# Patient Record
Sex: Female | Born: 1995 | Race: White | Hispanic: No | Marital: Married | Smoking: Never smoker
Health system: Southern US, Community
[De-identification: ages and names within clinical notes are randomized; demographics above are authoritative.]

## PROBLEM LIST (undated history)

## (undated) DIAGNOSIS — L309 Dermatitis, unspecified: Secondary | ICD-10-CM

## (undated) DIAGNOSIS — N3010 Interstitial cystitis (chronic) without hematuria: Secondary | ICD-10-CM

## (undated) HISTORY — DX: Dermatitis, unspecified: L30.9

## (undated) HISTORY — DX: Interstitial cystitis (chronic) without hematuria: N30.10

## (undated) HISTORY — PX: WISDOM TOOTH EXTRACTION: SHX21

---
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
## 2015-02-23 ENCOUNTER — Encounter: Payer: Self-pay | Admitting: Gynecology

## 2015-03-28 ENCOUNTER — Encounter: Payer: Self-pay | Admitting: Gynecology

## 2015-03-28 ENCOUNTER — Ambulatory Visit (INDEPENDENT_AMBULATORY_CARE_PROVIDER_SITE_OTHER): Payer: 59 | Admitting: Gynecology

## 2015-03-28 VITALS — BP 120/74 | Ht 70.0 in | Wt 156.0 lb

## 2015-03-28 DIAGNOSIS — Z01419 Encounter for gynecological examination (general) (routine) without abnormal findings: Secondary | ICD-10-CM

## 2015-03-28 DIAGNOSIS — N912 Amenorrhea, unspecified: Secondary | ICD-10-CM | POA: Diagnosis not present

## 2015-03-28 DIAGNOSIS — Z113 Encounter for screening for infections with a predominantly sexual mode of transmission: Secondary | ICD-10-CM | POA: Diagnosis not present

## 2015-03-28 LAB — COMPREHENSIVE METABOLIC PANEL
ALT: 16 U/L (ref 5–32)
AST: 17 U/L (ref 12–32)
Albumin: 4.7 g/dL (ref 3.6–5.1)
Alkaline Phosphatase: 47 U/L (ref 47–176)
BUN: 6 mg/dL — ABNORMAL LOW (ref 7–20)
CO2: 26 mmol/L (ref 20–31)
Calcium: 9.8 mg/dL (ref 8.9–10.4)
Chloride: 102 mmol/L (ref 98–110)
Creat: 0.74 mg/dL (ref 0.50–1.00)
Glucose, Bld: 71 mg/dL (ref 65–99)
Potassium: 3.7 mmol/L — ABNORMAL LOW (ref 3.8–5.1)
Sodium: 138 mmol/L (ref 135–146)
Total Bilirubin: 0.6 mg/dL (ref 0.2–1.1)
Total Protein: 7.3 g/dL (ref 6.3–8.2)

## 2015-03-28 LAB — CBC WITH DIFFERENTIAL/PLATELET
BASOS PCT: 0 % (ref 0–1)
Basophils Absolute: 0 10*3/uL (ref 0.0–0.1)
Eosinophils Absolute: 0.2 10*3/uL (ref 0.0–0.7)
Eosinophils Relative: 2 % (ref 0–5)
HCT: 41.1 % (ref 36.0–46.0)
HEMOGLOBIN: 14 g/dL (ref 12.0–15.0)
LYMPHS ABS: 2.7 10*3/uL (ref 0.7–4.0)
Lymphocytes Relative: 34 % (ref 12–46)
MCH: 29.5 pg (ref 26.0–34.0)
MCHC: 34.1 g/dL (ref 30.0–36.0)
MCV: 86.7 fL (ref 78.0–100.0)
MONOS PCT: 6 % (ref 3–12)
MPV: 9.6 fL (ref 8.6–12.4)
Monocytes Absolute: 0.5 10*3/uL (ref 0.1–1.0)
NEUTROS ABS: 4.6 10*3/uL (ref 1.7–7.7)
NEUTROS PCT: 58 % (ref 43–77)
Platelets: 315 10*3/uL (ref 150–400)
RBC: 4.74 MIL/uL (ref 3.87–5.11)
RDW: 13.7 % (ref 11.5–15.5)
WBC: 7.9 10*3/uL (ref 4.0–10.5)

## 2015-03-28 MED ORDER — NORETHINDRONE-ETH ESTRADIOL 1-35 MG-MCG PO TABS: 1.0000 | ORAL_TABLET | Freq: Every day | ORAL | Status: DC

## 2015-03-28 NOTE — Patient Instructions (Signed)
Start on the new birth control pills. Call me in several months to let me know how you're doing regardless.

## 2015-03-28 NOTE — Progress Notes (Signed)
Desiree BerlinJessie L Garrett Dec 24, 1995 [id number]        19 y.o.  G0P0  for annual exam.  Several issues noted below.  Past medical history,surgical history, problem list, medications, allergies, family history and social history were all reviewed and documented as reviewed in the EPIC chart.  ROS:  Performed with pertinent positives and negatives included in the history, assessment and plan.   Additional significant findings :  none   Exam: [name] personassistant Filed Vitals:   03/28/15 1157  BP: 120/74  Height: 5\' 10"  (1.778 m)  Weight: 156 lb (70.761 kg)   General appearance:  Normal affect, orientation and appearance. Skin: Grossly normal HEENT: Without gross lesions.  No cervical or supraclavicular adenopathy. Thyroid normal.  Lungs:  Clear without wheezing, rales or rhonchi Cardiac: RR, without RMG Abdominal:  Soft, nontender, without masses, guarding, rebound, organomegaly or hernia Breasts:  Examined lying and sitting without masses, retractions, discharge or axillary adenopathy. Pelvic:  Ext/BUS/vagina normal  Cervix normal. GC/Chlamydia  Uterus anteverted, normal size, shape and contour, midline and mobile nontender   Adnexa  Without masses or tenderness    Anus and perineum  Normal     Assessment/Plan:  19 y.o. G0P0 female for annual exam without menses, oral contraceptives.   1. Amenorrhea. Patient with a history of amenorrhea felt to be secondary to a PCOS type pattern. Had negative TSH FSH prolactin with normal estradiol level. Ultrasound showed multiple small ovarian follicles. Had progesterone withdrawal last year and was started on Loestrin 120 BCPs. Has not had a withdrawal with these with out any bleeding. Patient concerned as far as fertility in the future. I reviewed the whole issue of PCOS and availability of ovulatory stimulation when she decides she wants to proceed with pregnancy.  She is not interested in this at this point. I discussed various options to include continuing on  the low-dose pills despite not was dry this will provide her with endometrial protection. She seems more concerned about not bleeding and will go ahead and start Ortho-Novum 1/35 equivalents to see if this will not stimulate a little bit of withdrawal bleeding with the higher estrogen dose.  The patient is going to call me in several months after starting regardless to let me know how she is doing. 2. STD screening. GC/Chlamydia screen done as she is sexually active. Need for condoms regardless to decrease STD risks. 3. Breast health. SBE monthly reviewed. 4. Gardasil series discussed several times and declined. 5. Health maintenance. Patient requests a general screening blood test. 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 CBC, comprehensive metabolic panel urinalysis ordered. Discussed cholesterol screening at her age not warranted. Patient's going to call me in follow up in several months a source of bleeding.   Dara LordsFONTAINE,Nickey Canedo P MD, 12:30 PM 03/28/2015

## 2015-03-29 ENCOUNTER — Other Ambulatory Visit: Payer: Self-pay | Admitting: Gynecology

## 2015-03-29 DIAGNOSIS — E876 Hypokalemia: Secondary | ICD-10-CM

## 2015-03-29 LAB — URINALYSIS W MICROSCOPIC + REFLEX CULTURE
BACTERIA UA: NONE SEEN [HPF]
BILIRUBIN URINE: NEGATIVE
CRYSTALS: NONE SEEN [HPF]
Casts: NONE SEEN [LPF]
GLUCOSE, UA: NEGATIVE
HGB URINE DIPSTICK: NEGATIVE
Ketones, ur: NEGATIVE
Nitrite: NEGATIVE
PROTEIN: NEGATIVE
RBC / HPF: NONE SEEN RBC/HPF (ref <=2)
Specific Gravity, Urine: 1.003 (ref 1.001–1.035)
YEAST: NONE SEEN [HPF]
pH: 7 (ref 5.0–8.0)

## 2015-03-29 LAB — GC/CHLAMYDIA PROBE AMP
CT Probe RNA: NEGATIVE
GC Probe RNA: NEGATIVE

## 2015-03-29 LAB — HCG, SERUM, QUALITATIVE: Preg, Serum: NEGATIVE

## 2015-03-30 LAB — URINE CULTURE: Colony Count: >=100000

## 2015-05-18 ENCOUNTER — Ambulatory Visit
Admission: EM | Admit: 2015-05-18 | Discharge: 2015-05-18 | Disposition: A | Discharge status: Home / Self Care | Payer: 59 | Attending: Family Medicine | Admitting: Family Medicine

## 2015-05-18 ENCOUNTER — Encounter: Payer: Self-pay | Admitting: Emergency Medicine

## 2015-05-18 DIAGNOSIS — N390 Urinary tract infection, site not specified: Secondary | ICD-10-CM

## 2015-05-18 LAB — URINALYSIS COMPLETE WITH MICROSCOPIC (ARMC ONLY)
BILIRUBIN URINE: NEGATIVE
Glucose, UA: NEGATIVE mg/dL
Ketones, ur: NEGATIVE mg/dL
Nitrite: NEGATIVE
PH: 6 (ref 5.0–8.0)
PROTEIN: NEGATIVE mg/dL
Specific Gravity, Urine: >1.03 — ABNORMAL HIGH (ref 1.005–1.030)

## 2015-05-18 LAB — PREGNANCY, URINE: PREG TEST UR: NEGATIVE

## 2015-05-18 MED ORDER — CIPROFLOXACIN HCL 500 MG PO TABS: 500.0000 mg | ORAL_TABLET | Freq: Two times a day (BID) | ORAL | Status: DC

## 2015-05-18 MED ORDER — PHENAZOPYRIDINE HCL 200 MG PO TABS: 200.0000 mg | ORAL_TABLET | Freq: Three times a day (TID) | ORAL | Status: DC | PRN

## 2015-05-18 NOTE — ED Notes (Signed)
Patient c/o burning when urinating and increase in urinary urgency since yesterday.  Patient denies fevers.  Patient denies N/V.

## 2015-05-18 NOTE — Discharge Instructions (Signed)
Antibiotic Medicine °Antibiotic medicines are used to treat infections caused by bacteria. They work by hurting or killing the germs that are making you sick. °HOW WILL MY MEDICINE BE PICKED? °There are many kinds of antibiotic medicines. To help your doctor pick one, tell your doctor if: °· You have any allergies. °· You are pregnant or plan to get pregnant. °· You are breastfeeding. °· You are taking any medicines. These include over-the-counter medicines, prescription medicines, and herbal remedies. °· You have a medical condition or problem. °If you have questions about why your medicine was picked, ask. °FOR HOW LONG SHOULD I TAKE MY MEDICINE? °Take your medicine for as long as your doctor tells you to. Do not stop taking it when you feel better. If you stop taking it too soon: °· You may start to feel sick again. °· Your infection may get harder to treat. °· New problems may develop. °WHAT IF I MISS A DOSE? °Try not to miss any doses of antibiotic medicine. If you miss a dose: °· Take the dose as soon as you can. °· If you are taking 2 doses a day, take the next dose in 5 to 6 hours. °· If you are taking 3 or more doses a day, take the next dose in 2 to 4 hours. Then go back to the normal schedule. °If you cannot take a missed dose, take the next dose on time. Then take the missed dose after you have taken all the doses as told by your doctor, as if you had one more dose left. °DOES THIS MEDICINE AFFECT BIRTH CONTROL? °Birth control pills may not work while you are on antibiotic medicines. If you are taking birth control pills, keep taking them as usual. Use a second form of birth control, such as a condom. Keep using the second form of birth control until you are finished with your current 1 month cycle of birth control pills. °GET HELP IF: °· You get worse. °· You do not feel better a few days after starting the medicine. °· You throw up (vomit). °· There are white patches in your mouth. °· You have new  joint pain after starting the medicine. °· You have new muscle aches after starting the medicine. °· You had a fever before starting the medicine, and it comes back. °· You have any symptoms of an allergic reaction, such as an itchy rash. If this happens, stop taking the medicine. °GET HELP RIGHT AWAY IF: °· Your pee (urine) turns dark or becomes blood-colored. °· Your skin turns yellow. °· You bruise or bleed easily. °· You have very bad watery poop (diarrhea) and cramps in your belly (abdomen). °· You have a very bad headache. °· You have signs of a very bad allergic reaction, such as: °¨ Trouble breathing. °¨ Wheezing. °¨ Swelling of the lips, tongue, or face. °¨ Fainting. °¨ Blisters on the skin or in the mouth. °If you have signs of a very bad allergic reaction, stop taking the antibiotic medicine right away. °  °This information is not intended to replace advice given to you by your health care provider. Make sure you discuss any questions you have with your health care provider. °  °Document Released: 02/05/2008 Document Revised: 01/17/2015 Document Reviewed: 09/13/2014 °Elsevier Interactive Patient Education ©2016 [employer] Inc[employer] ° °Urinary Tract Infection °A urinary tract infection (UTI) can occur any place along the urinary tract. The tract includes the kidneys, ureters, bladder, and urethra. A type of germ called bacteria   often causes a UTI. UTIs are often helped with antibiotic medicine.  °HOME CARE  °· If given, take antibiotics as told by your doctor. Finish them even if you start to feel better. °· Drink enough fluids to keep your pee (urine) clear or pale yellow. °· Avoid tea, drinks with caffeine, and bubbly (carbonated) drinks. °· Pee often. Avoid holding your pee in for a long time. °· Pee before and after having sex (intercourse). °· Wipe from front to back after you poop (bowel movement) if you are a woman. Use each tissue only once. °GET HELP RIGHT AWAY IF:  °· You have back pain. °· You have  lower belly (abdominal) pain. °· You have chills. °· You feel sick to your stomach (nauseous). °· You throw up (vomit). °· Your burning or discomfort with peeing does not go away. °· You have a fever. °· Your symptoms are not better in 3 days. °MAKE SURE YOU:  °· Understand these instructions. °· Will watch your condition. °· Will get help right away if you are not doing well or get worse. °  °This information is not intended to replace advice given to you by your health care provider. Make sure you discuss any questions you have with your health care provider. °  °Document Released: 10/15/2007 Document Revised: 05/19/2014 Document Reviewed: 11/27/2011 °Elsevier Interactive Patient Education ©2016 Elsevier Inc. ° °

## 2015-05-18 NOTE — ED Provider Notes (Signed)
CSN: [id number]     Arrival date & time 05/18/15  [id number] History   First MD Initiated Contact with Patient 05/18/15 0945    Nurses notes were reviewed. Chief Complaint  Patient presents with  . Dysuria    Patient reports symptoms of dysuria started on Monday and they got worse yesterday Thursday. After seem like it was starting to resolve. She had a UTI before but is always wipes down.  Does not smoke no significant family medical history her medical problems reviewed with the family this pertinent to UTIs. She does not have any significant medical problems.   (Consider location/radiation/quality/duration/timing/severity/associated sxs/prior Treatment) Patient is a 20 y.o. female presenting with dysuria. The history is provided by the patient. No language interpreter was used.  Dysuria Pain quality:  Burning Pain severity:  Moderate Timing:  Intermittent Progression:  Worsening Chronicity:  New Relieved by:  Nothing Urinary symptoms: frequent urination   Urinary symptoms: no hesitancy   Associated symptoms: no abdominal pain, no flank pain and no vaginal discharge   Risk factors: recurrent urinary tract infections   Risk factors: no hx of pyelonephritis, no hx of urolithiasis, not pregnant, no renal disease and not single kidney     History reviewed. No pertinent past medical history. Past Surgical History  Procedure Laterality Date  . Wisdom tooth extraction     Family History  Problem Relation Age of Onset  . Diabetes Father   . Asthma Father   . Asthma Brother   . Cancer Maternal Grandfather     Prostate  . Diabetes Paternal Grandmother   . Diabetes Paternal Grandfather    Social History  Substance Use Topics  . Smoking status: Never Smoker   . Smokeless tobacco: None  . Alcohol Use: No   OB History    Gravida Para Term Preterm AB TAB SAB Ectopic Multiple Living   0              Review of Systems  Gastrointestinal: Negative for abdominal pain and abdominal  distention.  Genitourinary: Positive for dysuria. Negative for flank pain and vaginal discharge.  All other systems reviewed and are negative.   Allergies  Review of patient's allergies indicates no known allergies.  Home Medications   Prior to Admission medications   Medication Sig Start Date End Date Taking? Authorizing Provider  ciprofloxacin (CIPRO) 500 MG tablet Take 1 tablet (500 mg total) by mouth 2 (two) times daily. 05/18/15   Hassan RowanEugene Anjalee Cope, MD  norethindrone-ethinyl estradiol (MICROGESTIN,JUNEL,LOESTRIN) 1-20 MG-MCG tablet Take 1 tablet by mouth daily. Patient not taking: Reported on 03/28/2015 02/27/14   Dara Lordsimothy P Fontaine, MD  norethindrone-ethinyl estradiol 1/35 (ORTHO-NOVUM, NORTREL,CYCLAFEM) tablet Take 1 tablet by mouth daily. 03/28/15   Dara Lordsimothy P Fontaine, MD  phenazopyridine (PYRIDIUM) 200 MG tablet Take 1 tablet (200 mg total) by mouth 3 (three) times daily as needed for pain. 05/18/15   Hassan RowanEugene Eldine Rencher, MD   Meds Ordered and Administered this Visit  Medications - No data to display  BP 131/72 mmHg  Pulse 77  Temp(Src) 98.4 F (36.9 C) (Tympanic)  Resp 16  Ht 5' 10.5" (1.791 m)  Wt 150 lb (68.04 kg)  BMI 21.21 kg/m2  SpO2 100%  LMP 04/27/2015 (Approximate) No data found.   Physical Exam  Constitutional: She is oriented to person, place, and time. She appears well-developed and well-nourished.  HENT:  Head: Normocephalic and atraumatic.  Eyes: Conjunctivae are normal. Pupils are equal, round, and reactive to light.  Neck: Normal range  of motion. Neck supple.  Abdominal: Soft. Bowel sounds are normal. She exhibits no distension and no fluid wave. There is no tenderness. There is no CVA tenderness.  Musculoskeletal: Normal range of motion. She exhibits no edema.  Neurological: She is alert and oriented to person, place, and time.  Skin: Skin is warm and dry.  Psychiatric: She has a normal mood and affect. Her behavior is normal.  Vitals reviewed.   ED Course   Procedures (including critical care time)  Labs Review Labs Reviewed  URINALYSIS COMPLETEWITH MICROSCOPIC ([facility] ONLY) - Abnormal; Notable for the following:    APPearance HAZY (*)    Specific Gravity, Urine >1.030 (*)    Hgb urine dipstick TRACE (*)    Leukocytes, UA 1+ (*)    Bacteria, UA RARE (*)    Squamous Epithelial / LPF 0-5 (*)    All other components within normal limits  URINE CULTURE  PREGNANCY, URINE    Imaging Review No results found.   Visual Acuity Review  Right Eye Distance:   Left Eye Distance:   Bilateral Distance:    Right Eye Near:   Left Eye Near:    Bilateral Near:     Results for orders placed or performed during the hospital encounter of 05/18/15  Urinalysis complete, with microscopic  Result Value Ref Range   Color, Urine YELLOW YELLOW   APPearance HAZY (A) CLEAR   Glucose, UA NEGATIVE NEGATIVE mg/dL   Bilirubin Urine NEGATIVE NEGATIVE   Ketones, ur NEGATIVE NEGATIVE mg/dL   Specific Gravity, Urine >1.030 (H) 1.005 - 1.030   Hgb urine dipstick TRACE (A) NEGATIVE   pH 6.0 5.0 - 8.0   Protein, ur NEGATIVE NEGATIVE mg/dL   Nitrite NEGATIVE NEGATIVE   Leukocytes, UA 1+ (A) NEGATIVE   RBC / HPF 0-5 0 - 5 RBC/hpf   WBC, UA 6-30 0 - 5 WBC/hpf   Bacteria, UA RARE (A) NONE SEEN   Squamous Epithelial / LPF 0-5 (A) NONE SEEN   Mucous PRESENT   Pregnancy, urine  Result Value Ref Range   Preg Test, Ur NEGATIVE NEGATIVE      MDM   1. UTI (lower urinary tract infection)     We'll place on Cipro 500 mg twice a day for 5 days. And Pyridium for 5 days as well. Follow-up PCP if needed for dictation.   Hassan Rowan, MD 05/18/15 1040

## 2015-05-19 LAB — URINE CULTURE: SPECIAL REQUESTS: NORMAL

## 2015-05-23 ENCOUNTER — Telehealth: Payer: Self-pay | Admitting: Emergency Medicine

## 2015-05-23 NOTE — ED Notes (Signed)
Patient was notified that the lab could not complete a urine culture on the specimen that was collected.  Patient states that she is feeling much better and is having no urinary pain or symptoms at this time.  Patient states that she did complete her course of Cipro.  Patient was instructed to follow-up here or with her PCP if her urinary symptoms returned for a repeat urine test.  Patient verbalized understanding.

## 2015-05-28 ENCOUNTER — Telehealth: Payer: Self-pay | Admitting: *Deleted

## 2015-05-28 ENCOUNTER — Telehealth: Payer: Self-pay

## 2015-05-28 NOTE — ED Notes (Signed)
Patient called with complaint of yeast infection due to taking cipro for her UTI. Dr Thurmond ButtsWade was consulted and a prescription for diflucan 150 mg 1 dose [facility] [place] [place] Patient confirmed prescription and location of pharmacy.

## 2015-05-28 NOTE — Telephone Encounter (Signed)
Patient is calling to follow up as requested per Dr. Velvet BatheF at visit. She wanted to let Dr. Velvet BatheF know that the new OC's are working great. She feels good on them and has been on them for two packs and has had a period each pack. She wants to continue taking them.

## 2015-05-28 NOTE — Telephone Encounter (Signed)
Okay to refill through her next annual exam if it is not done already

## 2016-02-24 ENCOUNTER — Ambulatory Visit
Admission: EM | Admit: 2016-02-24 | Discharge: 2016-02-24 | Disposition: A | Discharge status: Home / Self Care | Payer: 59 | Attending: Family Medicine | Admitting: Family Medicine

## 2016-02-24 ENCOUNTER — Encounter: Payer: Self-pay | Admitting: *Deleted

## 2016-02-24 DIAGNOSIS — L02416 Cutaneous abscess of left lower limb: Secondary | ICD-10-CM

## 2016-02-24 DIAGNOSIS — L03116 Cellulitis of left lower limb: Secondary | ICD-10-CM

## 2016-02-24 MED ORDER — SULFAMETHOXAZOLE-TRIMETHOPRIM 800-160 MG PO TABS: 1.0000 | ORAL_TABLET | Freq: Two times a day (BID) | ORAL | 0 refills | Status: DC

## 2016-02-24 MED ORDER — MUPIROCIN 2 % EX OINT: TOPICAL_OINTMENT | CUTANEOUS | 0 refills | Status: DC

## 2016-02-24 MED ORDER — FLUCONAZOLE 150 MG PO TABS: 150.0000 mg | ORAL_TABLET | Freq: Every day | ORAL | 0 refills | Status: DC

## 2016-02-24 NOTE — ED Provider Notes (Signed)
[facility] ____________________________________________  Time seen: Approximately 3:42 PM  I have reviewed the triage vital signs and the nursing notes.   HISTORY  Chief Complaint Leg Injury   HPI Desiree Garrett is a 20 y.o. female presents for evaluation of a red swollen area to left side. Patient reports areas and present 2-3 days. Patient states smart about area started out as a small pimple appearance, unsure if there was a scratch or abrasion present initially. Patient states that she does work in [employer] and often bumps her legs causes scratches as well as animal scratches her, declines known animal scratch. Denies animal bites. Patient reports gradual increase in redness. Patient states felt like the area had a knot in it prompted her to come in today to be evaluated. Denies any history of similar in the past. Denies history of MRSA her previous abscesses. Patient does report her significant other has history of abscesses. Denies any insect bite or tick bite or tick attachment. Patient reports that she feels well otherwise. Patient states the area is mildly tender. Patient reports she did notice that there is a mild amount of drainage earlier today.  Denies fevers. Denies recent antibiotic use or recent sickness. Denies any extremity swelling or decreased range of motion. Denies chest pain, short is recommended abdominal pain, dizziness, dysuria or back pain.  Patient's last menstrual period was 02/24/2016 (exact date). Patient reports just finished her oral birth control pack and started menstrual today. Declines pregnancy.   History reviewed. No pertinent past medical history.  There are no active problems to display for this patient.   Past Surgical History:  Procedure Laterality Date  . WISDOM TOOTH EXTRACTION      Current Outpatient Rx  . Order #: [id number] Class: Normal  . Order #: [id number] Class: Normal  . Order #: [id number] Class: Normal  .  Order #: [id number] Class: Normal  . Order #: [id number] Class: Normal  . Order #: [id number] Class: Normal  . Order #: [id number] Class: Normal    No current facility-administered medications for this encounter.   Current Outpatient Prescriptions:  .  norethindrone-ethinyl estradiol 1/35 (ORTHO-NOVUM, NORTREL,CYCLAFEM) tablet, Take 1 tablet by mouth daily., Disp: 1 Package, Rfl: 12 .  ciprofloxacin (CIPRO) 500 MG tablet, Take 1 tablet (500 mg total) by mouth 2 (two) times daily., Disp: 10 tablet, Rfl: 0 .  fluconazole (DIFLUCAN) 150 MG tablet, Take 1 tablet (150 mg total) by mouth daily. Take one pill orally, then Repeat in one week as needed., Disp: 2 tablet, Rfl: 0 .  mupirocin ointment (BACTROBAN) 2 %, Apply three times a day for 5 days., Disp: 22 g, Rfl: 0 .  norethindrone-ethinyl estradiol (MICROGESTIN,JUNEL,LOESTRIN) 1-20 MG-MCG tablet, Take 1 tablet by mouth daily. (Patient not taking: Reported on 03/28/2015), Disp: 1 Package, Rfl: 11 .  phenazopyridine (PYRIDIUM) 200 MG tablet, Take 1 tablet (200 mg total) by mouth 3 (three) times daily as needed for pain., Disp: 15 tablet, Rfl: 0 .  sulfamethoxazole-trimethoprim (BACTRIM DS,SEPTRA DS) 800-160 MG tablet, Take 1 tablet by mouth 2 (two) times daily., Disp: 20 tablet, Rfl: 0  Allergies Review of patient's allergies indicates no known allergies.  Family History  Problem Relation Age of Onset  . Diabetes Father   . Asthma Father   . Asthma Brother   . Cancer Maternal Grandfather     Prostate  . Diabetes Paternal Grandmother   . Diabetes Paternal Grandfather     Social History Social History  Substance Use Topics  . Smoking status:  Never Smoker  . Smokeless tobacco: Never Used  . Alcohol use 0.0 oz/week    Review of Systems Constitutional: No fever/chills Eyes: No visual changes. ENT: No sore throat. Cardiovascular: Denies chest pain. Respiratory: Denies shortness of breath. Gastrointestinal: No abdominal pain.  No nausea, no  vomiting.  No diarrhea.  No constipation. Genitourinary: Negative for dysuria. Musculoskeletal: Negative for back pain. Skin: Negative for rash.As above.  Neurological: Negative for headaches, focal weakness or numbness.  10-point ROS otherwise negative.  ____________________________________________   PHYSICAL EXAM:  VITAL SIGNS: ED Triage Vitals  Enc Vitals Group     BP 02/24/16 1456 121/73     Pulse Rate 02/24/16 1456 96     Resp 02/24/16 1456 16     Temp 02/24/16 1456 99.1 F (37.3 C)     Temp Source 02/24/16 1456 Oral     SpO2 02/24/16 1456 99 %     Weight 02/24/16 1459 150 lb (68 kg)     Height 02/24/16 1459 5\' 11"  (1.803 m)     Head Circumference --      Peak Flow --      Pain Score 02/24/16 1501 4     Pain Loc --      Pain Edu? --      Excl. in GC? --     Constitutional: Alert and oriented. Well appearing and in no acute distress. Eyes: Conjunctivae are normal. PERRL. EOMI. ENT      Head: Normocephalic and atraumatic.      Nose: No congestion/rhinnorhea.      Mouth/Throat: Mucous membranes are moist. Cardiovascular: Normal rate, regular rhythm. Grossly normal heart sounds.  Good peripheral circulation. Respiratory: Normal respiratory effort without tachypnea nor retractions. Breath sounds are clear and equal bilaterally. No wheezes/rales/rhonchi.. Gastrointestinal: Soft and nontender.  Musculoskeletal:  Ambulatory with steady gait. No midline cervical, thoracic or lumbar tenderness to palpation. Bilateral pedal pulses equal and easily palpated. Neurologic:  Normal speech and language. No gross focal neurologic deficits are appreciated. Speech is normal. No gait instability.  Skin:  Skin is warm, dry and intact. No rash noted. Except : Left distal dorsal thigh area of approximate 2 cm x 2 cm area of induration and mild surrounding erythema, no fluctuance, minimal purulent drainage noted, minimal tenderness to palpation, no surrounding tenderness, no swelling or skin  abnormalities noted. Full range of motion to left knee without tenderness. Left lower extremity otherwise nontender. Psychiatric: Mood and affect are normal. Speech and behavior are normal. Patient exhibits appropriate insight and judgment   ___________________________________________   LABS (all labs ordered are listed, but only abnormal results are displayed)  Labs Reviewed - No data to display   Patient declined culture of purulent drainage. ____________________________________________   PROCEDURES Procedures   INITIAL IMPRESSION / ASSESSMENT AND PLAN / ED COURSE  Pertinent labs & imaging results that were available during my care of the patient were reviewed by me and considered in my medical decision making (see chart for details).  Well appearing patient. No acute distress. Presents with complaints of left thigh redness and tender area. Patient noted with cellulitis with abscess. Abscess with minimal drainage, no fluctuance noted. Counseled regarding treatment plan of care. Patient declined I&D at this time and has some active drainage on its own as well as no fluctuance. Will treat patient with oral Bactrim and topical Bactroban. Patient requests prescription for oral Diflucan, Rx given. Encouraged supportive care, elevation, warm compresses and close monitoring. She declined culture drainage.Discussed indication, risks  and benefits of medications with patient.  Discussed follow up with Primary care physician this week. Discussed follow up and return parameters including no resolution or any worsening concerns. Patient verbalized understanding and agreed to plan.   ____________________________________________   FINAL CLINICAL IMPRESSION(S) / ED DIAGNOSES  Final diagnoses:  Cellulitis and abscess of left leg     Discharge Medication List as of 02/24/2016  3:44 PM    START taking these medications   Details  fluconazole (DIFLUCAN) 150 MG tablet Take 1 tablet (150 mg  total) by mouth daily. Take one pill orally, then Repeat in one week as needed., Starting Sun 02/24/2016, Normal    mupirocin ointment (BACTROBAN) 2 % Apply three times a day for 5 days., Normal    sulfamethoxazole-trimethoprim (BACTRIM DS,SEPTRA DS) 800-160 MG tablet Take 1 tablet by mouth 2 (two) times daily., Starting Sun 02/24/2016, Until Wed 03/05/2016, Normal        Note: This dictation was prepared with Dragon dictation along with smaller phrase technology. Any transcriptional errors that result from this process are unintentional.    Clinical Course      Renford Dills, NP 02/24/16 1829

## 2016-02-24 NOTE — ED Triage Notes (Signed)
Pt has a sore on left thigh, lateral aspect just above knee. 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 Site is reddened, edematous, and tender to touch. Occurred 2 days ago.

## 2016-02-24 NOTE — Discharge Instructions (Signed)
Take medication as prescribed. Rest. Drink plenty of fluids. Elevate and apply warm compresses.   Follow up with your primary care physician this week. Return to Urgent care for new or worsening concerns.

## 2016-02-26 ENCOUNTER — Ambulatory Visit
Admission: EM | Admit: 2016-02-26 | Discharge: 2016-02-26 | Disposition: A | Discharge status: Home / Self Care | Payer: 59 | Attending: Family Medicine | Admitting: Family Medicine

## 2016-02-26 ENCOUNTER — Encounter: Payer: Self-pay | Admitting: *Deleted

## 2016-02-26 DIAGNOSIS — L02416 Cutaneous abscess of left lower limb: Secondary | ICD-10-CM | POA: Diagnosis not present

## 2016-02-26 DIAGNOSIS — L03116 Cellulitis of left lower limb: Secondary | ICD-10-CM | POA: Diagnosis not present

## 2016-02-26 MED ORDER — DOXYCYCLINE HYCLATE 100 MG PO CAPS: 100.0000 mg | ORAL_CAPSULE | Freq: Two times a day (BID) | ORAL | 0 refills | Status: DC

## 2016-02-26 MED ORDER — HYDROCODONE-ACETAMINOPHEN 5-325 MG PO TABS: 1.0000 | ORAL_TABLET | Freq: Three times a day (TID) | ORAL | 0 refills | Status: DC | PRN

## 2016-02-26 MED ORDER — CEFTRIAXONE SODIUM 1 G IJ SOLR
1.0000 g | Freq: Once | INTRAMUSCULAR | Status: AC
  Administered 2016-02-26: 1 g via INTRAMUSCULAR

## 2016-02-26 MED ORDER — LIDOCAINE HCL (PF) 1 % IJ SOLN: 5.0000 mL | Freq: Once | INTRAMUSCULAR | Status: DC

## 2016-02-26 NOTE — ED Triage Notes (Signed)
Left leg insect bite, seen here Sunday. Returns today with increased pain, nausea, and states site is draining. Also states inguinal lymph nodes are enlarged. Feels much worse today.

## 2016-02-26 NOTE — ED Provider Notes (Signed)
[facility] ____________________________________________  Time seen: Approximately 1:27 PM  I have reviewed the triage vital signs and the nursing notes.   HISTORY  Chief Complaint Insect Bite  HPI Desiree Garrett is a 20 y.o. female presents for follow-up and recheck of left leg cellulitis. Patient reports that she was seen here this past Sunday and placed on oral Bactrim. Patient reports she has since taken 5 doses of Bactrim, however reports that the redness in the harness as well as the pain to the area has increased. Patient reports she has had occasional nausea as well as occasional drainage from the site. Patient also reports feeling that she had a an enlarged inguinal left lymph node. Patient reports taking the medication as prescribed. Patient reports has been elevating leg as well as applying warm compresses. Patient states mild pain to the area at this time, increases with direct palpation or with movement.  Denies any pain radiation, paresthesias, other skin changes or other extremity pain. Reports has continued to ambulate and remain active. Denies fevers. Reports continues to eat and drink well. Denies any other complaints.  Patient's last menstrual period was 4 weeks ago. Declines pregnancy and states starting menstrual today.  Elizabeth Sauer, MD: PCP   History reviewed. No pertinent past medical history.  There are no active problems to display for this patient.   Past Surgical History:  Procedure Laterality Date  . WISDOM TOOTH EXTRACTION      Current Outpatient Rx  . Order #: [id number] Class: Normal  . Order #: [id number] Class: Normal  . Order #: [id number] Class: Normal  . Order #: [id number] Class: Normal  . Order #: [id number] Class: Normal  . Order #: [id number] Class: Print  . Order #: [id number] Class: Normal  . Order #: [id number] Class: Normal    No current facility-administered medications for this encounter.   Current Outpatient Prescriptions:  .   ciprofloxacin (CIPRO) 500 MG tablet, Take 1 tablet (500 mg total) by mouth 2 (two) times daily., Disp: 10 tablet, Rfl: 0 .  fluconazole (DIFLUCAN) 150 MG tablet, Take 1 tablet (150 mg total) by mouth daily. Take one pill orally, then Repeat in one week as needed., Disp: 2 tablet, Rfl: 0 .  mupirocin ointment (BACTROBAN) 2 %, Apply three times a day for 5 days., Disp: 22 g, Rfl: 0 .  norethindrone-ethinyl estradiol (MICROGESTIN,JUNEL,LOESTRIN) 1-20 MG-MCG tablet, Take 1 tablet by mouth daily., Disp: 1 Package, Rfl: 11 .  doxycycline (VIBRAMYCIN) 100 MG capsule, Take 1 capsule (100 mg total) by mouth 2 (two) times daily., Disp: 20 capsule, Rfl: 0 .  HYDROcodone-acetaminophen (NORCO/VICODIN) 5-325 MG tablet, Take 1 tablet by mouth every 8 (eight) hours as needed for moderate pain or severe pain (do not drive or operate machinery while taking as can cause drowsiness)., Disp: 6 tablet, Rfl: 0 .  norethindrone-ethinyl estradiol 1/35 (ORTHO-NOVUM, NORTREL,CYCLAFEM) tablet, Take 1 tablet by mouth daily., Disp: 1 Package, Rfl: 12 .  phenazopyridine (PYRIDIUM) 200 MG tablet, Take 1 tablet (200 mg total) by mouth 3 (three) times daily as needed for pain., Disp: 15 tablet, Rfl: 0  Allergies Review of patient's allergies indicates no known allergies.  Family History  Problem Relation Age of Onset  . Diabetes Father   . Asthma Father   . Asthma Brother   . Cancer Maternal Grandfather     Prostate  . Diabetes Paternal Grandmother   . Diabetes Paternal Grandfather     Social History Social History  Substance Use Topics  . Smoking status: Never Smoker  .  Smokeless tobacco: Never Used  . Alcohol use 0.0 oz/week    Review of Systems Constitutional: No fever/chills Eyes: No visual changes. ENT: No sore throat. Cardiovascular: Denies chest pain. Respiratory: Denies shortness of breath. Gastrointestinal: No abdominal pain.  No nausea, no vomiting.  No diarrhea.  No constipation. Genitourinary:  Negative for dysuria. Musculoskeletal: Negative for back pain. Skin: As above. Neurological: Negative for headaches, focal weakness or numbness.  10-point ROS otherwise negative.  ____________________________________________   PHYSICAL EXAM:  VITAL SIGNS: ED Triage Vitals  Enc Vitals Group     BP 02/26/16 1216 125/73     Pulse Rate 02/26/16 1216 89     Resp 02/26/16 1216 16     Temp 02/26/16 1216 98.2 F (36.8 C)     Temp Source 02/26/16 1216 Oral     SpO2 02/26/16 1216 100 %     Weight 02/26/16 1217 150 lb (68 kg)     Height 02/26/16 1217 5\' 11"  (1.803 m)     Head Circumference --      Peak Flow --      Pain Score 02/26/16 1220 7     Pain Loc --      Pain Edu? --      Excl. in GC? --     Constitutional: Alert and oriented. Well appearing and in no acute distress. Eyes: Conjunctivae are normal. PERRL. EOMI. ENT      Head: Normocephalic and atraumatic.      Nose: No congestion/rhinnorhea.      Mouth/Throat: Mucous membranes are moist.Oropharynx non-erythematous. Neck: No stridor. Supple without meningismus.  Hematological/Lymphatic/Immunilogical: No cervical lymphadenopathy.No definitive inguinal lymph node swelling noted. Cardiovascular: Normal rate, regular rhythm. Grossly normal heart sounds.  Good peripheral circulation. Respiratory: Normal respiratory effort without tachypnea nor retractions. Breath sounds are clear and equal bilaterally. No wheezes/rales/rhonchi.. Gastrointestinal: Soft and nontender. No distention.  No CVA tenderness. Musculoskeletal:  Nontender with normal range of motion in all extremities. No midline cervical, thoracic or lumbar tenderness to palpation. Bilateral pedal pulses equal and easily palpated. Except: Left distal lateral thigh with erythematous area of 8 x 9 cm with mild induration, with centered area of mild to moderate induration with slight fluctuance, minimal purulent drainage visible at this time, mild to moderate tenderness to  direct palpation, no further surrounding erythema or tenderness, left knee with full range of motion without difficulty as well as left lower extremity otherwise nontender.No calf tenderness bilaterally.  Neurologic:  Normal speech and language. No gross focal neurologic deficits are appreciated. Speech is normal. No gait instability.  Skin:  Skin is warm, dry and intact. No rash noted. Psychiatric: Mood and affect are normal. Speech and behavior are normal. Patient exhibits appropriate insight and judgment   ___________________________________________   LABS (all labs ordered are listed, but only abnormal results are displayed)  Labs Reviewed  AEROBIC CULTURE (SUPERFICIAL SPECIMEN)     PROCEDURES Procedures  Procedure(s) performed:  Procedure(s) performed:  Procedure explained and verbal consent obtained. Consent: Verbal consent obtained. Written consent not obtained. Risks and benefits: risks, benefits and alternatives were discussed Patient identity confirmed: verbally with patient and hospital-assigned identification number  Consent given by: patient   I&D abscess Location: Left thigh Preparation: Patient was prepped and draped in the usual sterile fashion. Anesthesia with 1% Lidocaine 4 mls Irrigation solution: saline and betadine Amount of cleaning: copious Incision made with #11 blade scalpel mild to moderate amount purulent drainage immediately obtained with expression. Sterile forceps used to probe and  break up loculations.  1/4 " iodoform gauze used and packed.  Patient tolerate well. Wound well approximated post repair.  dressing applied.  Wound care instructions provided.  Observe for any signs of infection or other problems.   ________________________________________   INITIAL IMPRESSION / ASSESSMENT AND PLAN / ED COURSE  Pertinent labs & imaging results that were available during my care of the patient were reviewed by me and considered in my medical decision  making (see chart for details).  Well-appearing patient. No acute distress. Presents for follow-up and recheck of left leg cellulitis. Patient has been taking oral Bactrim as well as applying topical Bactroban as directed. Denies any history of abscesses in the past. Patient denied culture of wound on initial urgent care presentation, as well as declines wound culture at this time. Patient cellulitis does appear to have increased. 1 g IM Rocephin given once in urgent care. Abscess noted, I&D performed, patient tolerated well, mild to moderate amount of purulent material expressed. Packing placed. Discussed in detail with patient will change prescription to oral doxycycline, when necessary Vicodin half tablet as needed for breakthrough pain, rest and elevate. Patient requests further work note, work note for today and tomorrow given in the directed patient to follow up with primary care physician Dr. Yetta Barre in 2 days. Wound area traced and marked with skin pending. Directed patient strict follow-up and return parameters. Instructed regarding fevers, increased pain, increased swelling proceed directly to emergency room for possible IV antibiotics.  Discussed follow up with Primary care physician this week. Discussed follow up and return parameters including no resolution or any worsening concerns. Patient verbalized understanding and agreed to plan.   ____________________________________________   FINAL CLINICAL IMPRESSION(S) / ED DIAGNOSES  Final diagnoses:  Cellulitis and abscess of left leg     Discharge Medication List as of 02/26/2016  2:10 PM    START taking these medications   Details  doxycycline (VIBRAMYCIN) 100 MG capsule Take 1 capsule (100 mg total) by mouth 2 (two) times daily., Starting Tue 02/26/2016, Normal    HYDROcodone-acetaminophen (NORCO/VICODIN) 5-325 MG tablet Take 1 tablet by mouth every 8 (eight) hours as needed for moderate pain or severe pain (do not drive or operate  machinery while taking as can cause drowsiness)., Starting Tue 02/26/2016, Print        Note: This dictation was prepared with Dragon dictation along with smaller phrase technology. Any transcriptional errors that result from this process are unintentional.    Clinical Course      Renford Dills, NP 02/26/16 1936

## 2016-02-26 NOTE — Discharge Instructions (Signed)
Take medication as prescribed. Elevate. Keep clean. Monitor closely.   Follow up with your primary care physician in 2 days. Return to Urgent care for new or worsening concerns. As dicussed, proceed to ER for any worsening concerns.

## 2016-02-29 ENCOUNTER — Encounter: Payer: Self-pay | Admitting: Emergency Medicine

## 2016-02-29 ENCOUNTER — Ambulatory Visit
Admission: EM | Admit: 2016-02-29 | Discharge: 2016-02-29 | Disposition: A | Discharge status: Home / Self Care | Payer: 59 | Attending: Family Medicine | Admitting: Family Medicine

## 2016-02-29 ENCOUNTER — Encounter: Payer: Self-pay | Admitting: Family Medicine

## 2016-02-29 ENCOUNTER — Encounter: Payer: 59 | Admitting: Family Medicine

## 2016-02-29 DIAGNOSIS — L0291 Cutaneous abscess, unspecified: Secondary | ICD-10-CM

## 2016-02-29 NOTE — ED Provider Notes (Signed)
CSN: [id number]     Arrival date & time 02/29/16  [id number] History   First MD Initiated Contact with Patient 02/29/16 0932     Chief Complaint  Patient presents with  . Abscess   (Consider location/radiation/quality/duration/timing/severity/associated sxs/prior Treatment) HPI  This a 20 year old female who returns today following an I&D of a abscess of her distal left lateral thigh. She had had a large cellulitis with the abscess was initially treated with Septra. This did not seem to help and she returned. He required I&D with packing switching her to doxycycline and aggressive local care with warm compresses. His noticed a marked improvement in the amount of induration and no fluctuance. She still has purulence draining from the packing. The abscess has greatly consolidated according to the outline her last visit. She has been afebrile has not had any chills.     History reviewed. No pertinent past medical history. Past Surgical History:  Procedure Laterality Date  . WISDOM TOOTH EXTRACTION     Family History  Problem Relation Age of Onset  . Diabetes Father   . Asthma Father   . Asthma Brother   . Cancer Maternal Grandfather     Prostate  . Diabetes Paternal Grandmother   . Diabetes Paternal Grandfather    Social History  Substance Use Topics  . Smoking status: Never Smoker  . Smokeless tobacco: Never Used  . Alcohol use 0.0 oz/week   OB History    Gravida Para Term Preterm AB Living   0             SAB TAB Ectopic Multiple Live Births                 Review of Systems  Constitutional: Positive for activity change. Negative for chills, fatigue and fever.  Skin: Positive for wound.  All other systems reviewed and are negative.   Allergies  Review of patient's allergies indicates no known allergies.  Home Medications   Prior to Admission medications   Medication Sig Start Date End Date Taking? Authorizing Provider  doxycycline (VIBRAMYCIN) 100 MG capsule Take 1  capsule (100 mg total) by mouth 2 (two) times daily. 02/26/16   Renford DillsLindsey Brandy, NP  fluconazole (DIFLUCAN) 150 MG tablet Take 1 tablet (150 mg total) by mouth daily. Take one pill orally, then Repeat in one week as needed. 02/24/16   Renford DillsLindsey Knoth, NP  HYDROcodone-acetaminophen (NORCO/VICODIN) 5-325 MG tablet Take 1 tablet by mouth every 8 (eight) hours as needed for moderate pain or severe pain (do not drive or operate machinery while taking as can cause drowsiness). 02/26/16   Renford DillsLindsey Been, NP  mupirocin ointment (BACTROBAN) 2 % Apply three times a day for 5 days. 02/24/16   Renford DillsLindsey Alcocer, NP  norethindrone-ethinyl estradiol 1/35 (ORTHO-NOVUM, NORTREL,CYCLAFEM) tablet Take 1 tablet by mouth daily. 03/28/15   Dara Lordsimothy P Fontaine, MD   Meds Ordered and Administered this Visit  Medications - No data to display  BP 116/77 (BP Location: Left Arm)   Pulse 95   Temp 97.4 F (36.3 C) (Tympanic)   Resp 16   Ht 5\' 11"  (1.803 m)   Wt 149 lb 11.2 oz (67.9 kg)   LMP 02/24/2016 (Exact Date)   SpO2 100%   BMI 20.88 kg/m  No data found.   Physical Exam  Constitutional: She is oriented to person, place, and time. She appears well-developed and well-nourished. No distress.  HENT:  Head: Normocephalic and atraumatic.  Eyes: EOM are normal. Pupils are equal,  round, and reactive to light.  Neck: Normal range of motion. Neck supple.  Musculoskeletal: Normal range of motion.  Neurological: She is alert and oriented to person, place, and time.  Skin: Skin is warm and dry. She is not diaphoretic.  Examination of the abscess of the left side distal lateral shows a packing that was in place with sanguinous purulent drainage present. The drain was removed. Area of induration has a consolidated now to approximately 2 cm x 4 cm. Manipulation of the wound failed to have any more purulence expressed. She did have some blood drainage.  Psychiatric: She has a normal mood and affect. Her behavior is normal.  Judgment and thought content normal.  Nursing note and vitals reviewed.   Urgent Care Course   Clinical Course    Procedures (including critical care time)  Labs Review Labs Reviewed - No data to display  Imaging Review No results found.   Visual Acuity Review  Right Eye Distance:   Left Eye Distance:   Bilateral Distance:    Right Eye Near:   Left Eye Near:    Bilateral Near:         MDM   1. Abscess    The area of induration was outlined in surgical marker. The patient will continue on the doxycycline as this seems to be working well. I decided not to replace the drain as there is no further purulence. Also the incision was at the tip of the indurated area and would have to be extended further . More aggressive local wound care may help absorb the indurated area and the patient will be followed in 2 days and decision made whether any fluctuance needs to be addressed at that time. She will continue with warm compresses that she has been performing. We will increase it to 4 times daily. Dry the area and placed Bactroban on the wound. I'll keep her out of work today and tomorrow with a plan on having her return to full duty on Sunday unless there is a change in her condition. She was fully informed of our plan.    Lutricia Feil, PA-C 02/29/16 1015

## 2016-02-29 NOTE — ED Triage Notes (Signed)
Patient here for wound check and possible repacking.  Patient reports drainage from the site.  Patient denies fevers or chills.

## 2016-03-02 ENCOUNTER — Encounter: Payer: Self-pay | Admitting: Gynecology

## 2016-03-02 ENCOUNTER — Ambulatory Visit
Admission: EM | Admit: 2016-03-02 | Discharge: 2016-03-02 | Disposition: A | Discharge status: Home / Self Care | Payer: 59 | Attending: Family Medicine | Admitting: Family Medicine

## 2016-03-02 DIAGNOSIS — Z4801 Encounter for change or removal of surgical wound dressing: Secondary | ICD-10-CM

## 2016-03-02 DIAGNOSIS — Z5189 Encounter for other specified aftercare: Secondary | ICD-10-CM

## 2016-03-02 NOTE — ED Provider Notes (Signed)
CSN: [id number]     Arrival date & time 03/02/16  0807 History   None    Chief Complaint  Patient presents with  . Wound Check   (Consider location/radiation/quality/duration/timing/severity/associated sxs/prior Treatment) HPI  Patient returns with more improvement. Indurated area less without purulence. Still using doxycycline and warm compresses. No fever or chills.  History reviewed. No pertinent past medical history. Past Surgical History:  Procedure Laterality Date  . WISDOM TOOTH EXTRACTION     Family History  Problem Relation Age of Onset  . Diabetes Father   . Asthma Father   . Asthma Brother   . Cancer Maternal Grandfather     Prostate  . Diabetes Paternal Grandmother   . Diabetes Paternal Grandfather    Social History  Substance Use Topics  . Smoking status: Never Smoker  . Smokeless tobacco: Never Used  . Alcohol use 0.0 oz/week   OB History    Gravida Para Term Preterm AB Living   0             SAB TAB Ectopic Multiple Live Births                 Review of Systems  Constitutional: Negative for activity change, chills, fatigue and fever.  Skin: Positive for color change and wound.  All other systems reviewed and are negative.   Allergies  Review of patient's allergies indicates no known allergies.  Home Medications   Prior to Admission medications   Medication Sig Start Date End Date Taking? Authorizing Provider  doxycycline (VIBRAMYCIN) 100 MG capsule Take 1 capsule (100 mg total) by mouth 2 (two) times daily. 02/26/16  Yes Renford Dills, NP  fluconazole (DIFLUCAN) 150 MG tablet Take 1 tablet (150 mg total) by mouth daily. Take one pill orally, then Repeat in one week as needed. 02/24/16  Yes Renford Dills, NP  norethindrone-ethinyl estradiol 1/35 (ORTHO-NOVUM, NORTREL,CYCLAFEM) tablet Take 1 tablet by mouth daily. 03/28/15  Yes Dara Lords, MD  HYDROcodone-acetaminophen (NORCO/VICODIN) 5-325 MG tablet Take 1 tablet by mouth every 8 (eight)  hours as needed for moderate pain or severe pain (do not drive or operate machinery while taking as can cause drowsiness). 02/26/16   Renford Dills, NP  mupirocin ointment (BACTROBAN) 2 % Apply three times a day for 5 days. 02/24/16   Renford Dills, NP   Meds Ordered and Administered this Visit  Medications - No data to display  BP 119/69 (BP Location: Left Arm)   Pulse 69   Temp 98.3 F (36.8 C) (Oral)   Resp 16   LMP 02/24/2016 (Exact Date)   SpO2 100%  No data found.   Physical Exam  Constitutional: She is oriented to person, place, and time. She appears well-developed and well-nourished. No distress.  HENT:  Head: Normocephalic and atraumatic.  Eyes: EOM are normal. Pupils are equal, round, and reactive to light.  Neck: Normal range of motion. Neck supple.  Musculoskeletal: Normal range of motion. She exhibits no edema, tenderness or deformity.  Neurological: She is alert and oriented to person, place, and time.  Skin: Skin is warm and dry. She is not diaphoretic.  Induration much improved. No fluctuance. No drainage. Less erythema.   Psychiatric: She has a normal mood and affect. Her behavior is normal. Judgment and thought content normal.  Nursing note and vitals reviewed.   Urgent Care Course   Clinical Course    Procedures (including critical care time)  Labs Review Labs Reviewed - No data to  display  Imaging Review No results found.   Visual Acuity Review  Right Eye Distance:   Left Eye Distance:   Bilateral Distance:    Right Eye Near:   Left Eye Near:    Bilateral Near:         MDM   1. Visit for wound check   2. Abscess re-check    Will keep on warm compresses until induration resolved. Finish doxycycline. F/U PRN. RTW today.    Lutricia FeilWilliam P Audy Dauphine, PA-C 03/02/16 [phone]

## 2016-03-02 NOTE — ED Triage Notes (Signed)
Follow up x 2 days ago. Left knee abscess.

## 2016-03-03 NOTE — Progress Notes (Signed)
Erroneous encounter

## 2016-03-03 NOTE — Patient Instructions (Signed)
Erroneous encounter

## 2016-03-21 ENCOUNTER — Other Ambulatory Visit: Payer: Self-pay | Admitting: Gynecology

## 2016-03-28 ENCOUNTER — Ambulatory Visit (INDEPENDENT_AMBULATORY_CARE_PROVIDER_SITE_OTHER): Payer: 59 | Admitting: Gynecology

## 2016-03-28 ENCOUNTER — Encounter: Payer: Self-pay | Admitting: Gynecology

## 2016-03-28 VITALS — BP 120/70 | Ht 69.5 in | Wt 153.0 lb

## 2016-03-28 DIAGNOSIS — Z113 Encounter for screening for infections with a predominantly sexual mode of transmission: Secondary | ICD-10-CM | POA: Diagnosis not present

## 2016-03-28 DIAGNOSIS — Z01419 Encounter for gynecological examination (general) (routine) without abnormal findings: Secondary | ICD-10-CM | POA: Diagnosis not present

## 2016-03-28 LAB — COMPREHENSIVE METABOLIC PANEL
ALBUMIN: 4.4 g/dL (ref 3.6–5.1)
ALT: 17 U/L (ref 6–29)
AST: 17 U/L (ref 10–30)
Alkaline Phosphatase: 40 U/L (ref 33–115)
BILIRUBIN TOTAL: 0.3 mg/dL (ref 0.2–1.2)
BUN: 7 mg/dL (ref 7–25)
CO2: 28 mmol/L (ref 20–31)
CREATININE: 0.68 mg/dL (ref 0.50–1.10)
Calcium: 9.5 mg/dL (ref 8.6–10.2)
Chloride: 104 mmol/L (ref 98–110)
GLUCOSE: 82 mg/dL (ref 65–99)
Potassium: 4 mmol/L (ref 3.5–5.3)
SODIUM: 139 mmol/L (ref 135–146)
Total Protein: 7.1 g/dL (ref 6.1–8.1)

## 2016-03-28 LAB — CBC WITH DIFFERENTIAL/PLATELET
Basophils Absolute: 62 cells/uL (ref 0–200)
Basophils Relative: 1 %
EOS PCT: 6 %
Eosinophils Absolute: 372 cells/uL (ref 15–500)
HCT: 40.8 % (ref 35.0–45.0)
HEMOGLOBIN: 13.3 g/dL (ref 11.7–15.5)
LYMPHS ABS: 2542 {cells}/uL (ref 850–3900)
Lymphocytes Relative: 41 %
MCH: 29.2 pg (ref 27.0–33.0)
MCHC: 32.6 g/dL (ref 32.0–36.0)
MCV: 89.7 fL (ref 80.0–100.0)
MONOS PCT: 6 %
MPV: 9.6 fL (ref 7.5–12.5)
Monocytes Absolute: 372 cells/uL (ref 200–950)
NEUTROS ABS: 2852 {cells}/uL (ref 1500–7800)
NEUTROS PCT: 46 %
PLATELETS: 320 10*3/uL (ref 140–400)
RBC: 4.55 MIL/uL (ref 3.80–5.10)
RDW: 13.6 % (ref 11.0–15.0)
WBC: 6.2 10*3/uL (ref 3.8–10.8)

## 2016-03-28 MED ORDER — NORETHINDRONE-ETH ESTRADIOL 1-35 MG-MCG PO TABS: 1.0000 | ORAL_TABLET | Freq: Every day | ORAL | 12 refills | Status: DC

## 2016-03-28 NOTE — Patient Instructions (Signed)
Recommend that you receive the Gardasil vaccine.  You may obtain a copy of any labs that were done today by logging onto MyChart as outlined in the instructions provided with your AVS (after visit summary). The office will not call with normal lab results but certainly if there are any significant abnormalities then we will contact you.   Health Maintenance Adopting a healthy lifestyle and getting preventive care can go a long way to promote health and wellness. Talk with your health care provider about what schedule of regular examinations is right for you. This is a good chance for you to check in with your provider about disease prevention and staying healthy. In between checkups, there are plenty of things you can do on your own. Experts have done a lot of research about which lifestyle changes and preventive measures are most likely to keep you healthy. Ask your health care provider for more information. WEIGHT AND DIET  Eat a healthy diet  Be sure to include plenty of vegetables, fruits, low-fat dairy products, and lean protein.  Do not eat a lot of foods high in solid fats, added sugars, or salt.  Get regular exercise. This is one of the most important things you can do for your health.  Most adults should exercise for at least 150 minutes each week. The exercise should increase your heart rate and make you sweat (moderate-intensity exercise).  Most adults should also do strengthening exercises at least twice a week. This is in addition to the moderate-intensity exercise.  Maintain a healthy weight  Body mass index (BMI) is a measurement that can be used to identify possible weight problems. It estimates body fat based on height and weight. Your health care provider can help determine your BMI and help you achieve or maintain a healthy weight.  For females 4 years of age and older:   A BMI below 18.5 is considered underweight.  A BMI of 18.5 to 24.9 is normal.  A BMI of 25 to  29.9 is considered overweight.  A BMI of 30 and above is considered obese.  Watch levels of cholesterol and blood lipids  You should start having your blood tested for lipids and cholesterol at 20 years of age, then have this test every 5 years.  You may need to have your cholesterol levels checked more often if:  Your lipid or cholesterol levels are high.  You are older than 20 years of age.  You are at high risk for heart disease.  CANCER SCREENING   Lung Cancer  Lung cancer screening is recommended for adults 8-71 years old who are at high risk for lung cancer because of a history of smoking.  A yearly low-dose CT scan of the lungs is recommended for people who:  Currently smoke.  Have quit within the past 15 years.  Have at least a 30-pack-year history of smoking. A pack year is smoking an average of one pack of cigarettes a day for 1 year.  Yearly screening should continue until it has been 15 years since you quit.  Yearly screening should stop if you develop a health problem that would prevent you from having lung cancer treatment.  Breast Cancer  Practice breast self-awareness. This means understanding how your breasts normally appear and feel.  It also means doing regular breast self-exams. Let your health care provider know about any changes, no matter how small.  If you are in your 20s or 30s, you should have a clinical breast exam (  CBE) by a health care provider every 1-3 years as part of a regular health exam.  If you are 40 or older, have a CBE every year. Also consider having a breast X-ray (mammogram) every year.  If you have a family history of breast cancer, talk to your health care provider about genetic screening.  If you are at high risk for breast cancer, talk to your health care provider about having an MRI and a mammogram every year.  Breast cancer gene (BRCA) assessment is recommended for women who have family members with BRCA-related  cancers. BRCA-related cancers include:  Breast.  Ovarian.  Tubal.  Peritoneal cancers.  Results of the assessment will determine the need for genetic counseling and BRCA1 and BRCA2 testing. Cervical Cancer Routine pelvic examinations to screen for cervical cancer are no longer recommended for nonpregnant women who are considered low risk for cancer of the pelvic organs (ovaries, uterus, and vagina) and who do not have symptoms. A pelvic examination may be necessary if you have symptoms including those associated with pelvic infections. Ask your health care provider if a screening pelvic exam is right for you.   The Pap test is the screening test for cervical cancer for women who are considered at risk.  If you had a hysterectomy for a problem that was not cancer or a condition that could lead to cancer, then you no longer need Pap tests.  If you are older than 65 years, and you have had normal Pap tests for the past 10 years, you no longer need to have Pap tests.  If you have had past treatment for cervical cancer or a condition that could lead to cancer, you need Pap tests and screening for cancer for at least 20 years after your treatment.  If you no longer get a Pap test, assess your risk factors if they change (such as having a new sexual partner). This can affect whether you should start being screened again.  Some women have medical problems that increase their chance of getting cervical cancer. If this is the case for you, your health care provider may recommend more frequent screening and Pap tests.  The human papillomavirus (HPV) test is another test that may be used for cervical cancer screening. The HPV test looks for the virus that can cause cell changes in the cervix. The cells collected during the Pap test can be tested for HPV.  The HPV test can be used to screen women 30 years of age and older. Getting tested for HPV can extend the interval between normal Pap tests from  three to five years.  An HPV test also should be used to screen women of any age who have unclear Pap test results.  After 20 years of age, women should have HPV testing as often as Pap tests.  Colorectal Cancer  This type of cancer can be detected and often prevented.  Routine colorectal cancer screening usually begins at 20 years of age and continues through 20 years of age.  Your health care provider may recommend screening at an earlier age if you have risk factors for colon cancer.  Your health care provider may also recommend using home test kits to check for hidden blood in the stool.  A small camera at the end of a tube can be used to examine your colon directly (sigmoidoscopy or colonoscopy). This is done to check for the earliest forms of colorectal cancer.  Routine screening usually begins at age 50.    Direct examination of the colon should be repeated every 5-10 years through 20 years of age. However, you may need to be screened more often if early forms of precancerous polyps or small growths are found. Skin Cancer  Check your skin from head to toe regularly.  Tell your health care provider about any new moles or changes in moles, especially if there is a change in a mole's shape or color.  Also tell your health care provider if you have a mole that is larger than the size of a pencil eraser.  Always use sunscreen. Apply sunscreen liberally and repeatedly throughout the day.  Protect yourself by wearing long sleeves, pants, a wide-brimmed hat, and sunglasses whenever you are outside. HEART DISEASE, DIABETES, AND HIGH BLOOD PRESSURE   Have your blood pressure checked at least every 1-2 years. High blood pressure causes heart disease and increases the risk of stroke.  If you are between 55 years and 79 years old, ask your health care provider if you should take aspirin to prevent strokes.  Have regular diabetes screenings. This involves taking a blood sample to check  your fasting blood sugar level.  If you are at a normal weight and have a low risk for diabetes, have this test once every three years after 20 years of age.  If you are overweight and have a high risk for diabetes, consider being tested at a younger age or more often. PREVENTING INFECTION  Hepatitis B  If you have a higher risk for hepatitis B, you should be screened for this virus. You are considered at high risk for hepatitis B if:  You were born in a country where hepatitis B is common. Ask your health care provider which countries are considered high risk.  Your parents were born in a high-risk country, and you have not been immunized against hepatitis B (hepatitis B vaccine).  You have HIV or AIDS.  You use needles to inject street drugs.  You live with someone who has hepatitis B.  You have had sex with someone who has hepatitis B.  You get hemodialysis treatment.  You take certain medicines for conditions, including cancer, organ transplantation, and autoimmune conditions. Hepatitis C  Blood testing is recommended for:  Everyone born from 1945 through 1965.  Anyone with known risk factors for hepatitis C. Sexually transmitted infections (STIs)  You should be screened for sexually transmitted infections (STIs) including gonorrhea and chlamydia if:  You are sexually active and are younger than 20 years of age.  You are older than 20 years of age and your health care provider tells you that you are at risk for this type of infection.  Your sexual activity has changed since you were last screened and you are at an increased risk for chlamydia or gonorrhea. Ask your health care provider if you are at risk.  If you do not have HIV, but are at risk, it may be recommended that you take a prescription medicine daily to prevent HIV infection. This is called pre-exposure prophylaxis (PrEP). You are considered at risk if:  You are sexually active and do not regularly use  condoms or know the HIV status of your partner(s).  You take drugs by injection.  You are sexually active with a partner who has HIV. Talk with your health care provider about whether you are at high risk of being infected with HIV. If you choose to begin PrEP, you should first be tested for HIV. You should then be   tested every 3 months for as long as you are taking PrEP.  PREGNANCY   If you are premenopausal and you may become pregnant, ask your health care provider about preconception counseling.  If you may become pregnant, take 400 to 800 micrograms (mcg) of folic acid every day.  If you want to prevent pregnancy, talk to your health care provider about birth control (contraception). OSTEOPOROSIS AND MENOPAUSE   Osteoporosis is a disease in which the bones lose minerals and strength with aging. This can result in serious bone fractures. Your risk for osteoporosis can be identified using a bone density scan.  If you are 23 years of age or older, or if you are at risk for osteoporosis and fractures, ask your health care provider if you should be screened.  Ask your health care provider whether you should take a calcium or vitamin D supplement to lower your risk for osteoporosis.  Menopause may have certain physical symptoms and risks.  Hormone replacement therapy may reduce some of these symptoms and risks. Talk to your health care provider about whether hormone replacement therapy is right for you.  HOME CARE INSTRUCTIONS   Schedule regular health, dental, and eye exams.  Stay current with your immunizations.   Do not use any tobacco products including cigarettes, chewing tobacco, or electronic cigarettes.  If you are pregnant, do not drink alcohol.  If you are breastfeeding, limit how much and how often you drink alcohol.  Limit alcohol intake to no more than 1 drink per day for nonpregnant women. One drink equals 12 ounces of beer, 5 ounces of wine, or 1 ounces of hard  liquor.  Do not use street drugs.  Do not share needles.  Ask your health care provider for help if you need support or information about quitting drugs.  Tell your health care provider if you often feel depressed.  Tell your health care provider if you have ever been abused or do not feel safe at home. Document Released: 11/11/2010 Document Revised: 09/12/2013 Document Reviewed: 03/30/2013 [facility] Patient Information 2015 [place], [place]. This information is not intended to replace advice given to you by your health care provider. Make sure you discuss any questions you have with your health care provider.

## 2016-03-28 NOTE — Progress Notes (Signed)
    Janet BerlinJessie L Junco 1996-04-09 [id number]        20 y.o.  G0P0  for annual exam.  Past medical history,surgical history, problem list, medications, allergies, family history and social history were all reviewed and documented as reviewed in the EPIC chart.  ROS:  Performed with pertinent positives and negatives included in the history, assessment and plan.   Additional significant findings :  None   Exam: Kennon PortelaKim Gardner assistant Vitals:   03/28/16 1059  BP: 120/70  Weight: 153 lb (69.4 kg)  Height: 5' 9.5" (1.765 m)   Body mass index is 22.27 kg/m.  General appearance:  Normal affect, orientation and appearance. Skin: Grossly normal HEENT: Without gross lesions.  No cervical or supraclavicular adenopathy. Thyroid normal.  Lungs:  Clear without wheezing, rales or rhonchi Cardiac: RR, without RMG Abdominal:  Soft, nontender, without masses, guarding, rebound, organomegaly or hernia Breasts:  Examined lying and sitting without masses, retractions, discharge or axillary adenopathy. Pelvic:  Ext, BUS, Vagina normal  Cervix normal. Pap smear done. GC/Chlamydia  Uterus anteverted, normal size, shape and contour, midline and mobile nontender   Adnexa without masses or tenderness    Anus and perineum normal    Assessment/Plan:  20 y.o. G0P0 female for annual exam with regular menses, oral contraceptives.   1. Oral contraceptives. Doing well on 1/35 pill. Having light regular menses. Refill 1 year provided. 2. STD screening. GC/Chlamydia done. Serum screening declined. 3. Gardasil series never. I again reviewed the benefits of the series of my recommendation that she receive these. She does not want to initiate now but will think about it return if she decides to pursue this.  4. Pap smear done today as her first Pap smear. She will be turning 21 within the next several months. 5. Breast health. SBE monthly reviewed. 6. Health maintenance. Patient requests routine blood work. CBC,  comprehensive metabolic panel, urinalysis ordered. Follow up in one year, sooner as needed.   Dara LordsFONTAINE,TIMOTHY P MD, 11:30 AM 03/28/2016

## 2016-03-28 NOTE — Addendum Note (Signed)
Addended by: Dayna BarkerGARDNER, Daviana Haymaker K on: 03/28/2016 11:37 AM   Modules accepted: Orders

## 2016-03-29 LAB — URINALYSIS W MICROSCOPIC + REFLEX CULTURE
Bacteria, UA: NONE SEEN [HPF]
Bilirubin Urine: NEGATIVE
CASTS: NONE SEEN [LPF]
Crystals: NONE SEEN [HPF]
Glucose, UA: NEGATIVE
Hgb urine dipstick: NEGATIVE
Ketones, ur: NEGATIVE
NITRITE: NEGATIVE
PH: 7 (ref 5.0–8.0)
Protein, ur: NEGATIVE
RBC / HPF: NONE SEEN RBC/HPF (ref <=2)
SPECIFIC GRAVITY, URINE: 1.008 (ref 1.001–1.035)
YEAST: NONE SEEN [HPF]

## 2016-03-29 LAB — GC/CHLAMYDIA PROBE AMP
CT PROBE, AMP APTIMA: NOT DETECTED
GC Probe RNA: NOT DETECTED

## 2016-03-30 LAB — URINE CULTURE

## 2016-04-02 LAB — PAP IG W/ RFLX HPV ASCU

## 2016-08-04 ENCOUNTER — Ambulatory Visit
Admission: EM | Admit: 2016-08-04 | Discharge: 2016-08-04 | Disposition: A | Discharge status: Home / Self Care | Payer: 59 | Attending: Emergency Medicine | Admitting: Emergency Medicine

## 2016-08-04 DIAGNOSIS — N390 Urinary tract infection, site not specified: Secondary | ICD-10-CM | POA: Diagnosis not present

## 2016-08-04 DIAGNOSIS — R8281 Pyuria: Secondary | ICD-10-CM

## 2016-08-04 DIAGNOSIS — R300 Dysuria: Secondary | ICD-10-CM

## 2016-08-04 LAB — URINALYSIS, COMPLETE (UACMP) WITH MICROSCOPIC

## 2016-08-04 MED ORDER — PHENAZOPYRIDINE HCL 200 MG PO TABS: 200.0000 mg | ORAL_TABLET | Freq: Three times a day (TID) | ORAL | 0 refills | Status: DC | PRN

## 2016-08-04 NOTE — ED Triage Notes (Signed)
Patient complains of urinary symptoms. Patient states that she has urinary urgency. Patient states that she has taken azo today. Patient states that she has some burning in vaginal region.

## 2016-08-04 NOTE — ED Provider Notes (Signed)
HPI  SUBJECTIVE:  Desiree Garrett is a 21 y.o. female who presents with  dysuria and urinary urgency starting today. She reports some "vaginal discomfort" but denies genital itching, swelling, rash, vaginal odor, discharge. She tried Azo and warm compresses with improvement in her symptoms. No aggravating factors. She denies urinary frequency, cloudy or odorous urine, hematuria. No abdominal, pelvic or back pain. No nausea, vomiting, fevers. No scented body washes, bubble baths. No antibiotics recently. She has not been spending time in wet athletic gear recently. She is in a long-term monogamous relationship with a female who is asymptomatic. She states that he was tested 3 weeks ago for HIV, syphilis, gonorrhea Chlamydia and Trichomonas and everything came back negative. She denies dyspareunia. She has not been tested for STDs since November 2017, but she states that STDs are not a concern today. She states that she does not drink sodas or caffeine, but that she drinks "lots of Kool-Aid." States that she did a home pregnancy test earlier today that was negative. She has a past medical history of UTIs and yeast infections, eczema. No history of gonorrhea chlamydia herpes HIV syphilis Trichomonas or BV. LMP: 3 weeks ago. Denies the possibility of being pregnant. PMD: Dr. Yetta BarreJones.   History reviewed. No pertinent past medical history.  Past Surgical History:  Procedure Laterality Date  . WISDOM TOOTH EXTRACTION      Family History  Problem Relation Age of Onset  . Diabetes Father   . Asthma Father   . Asthma Brother   . Cancer Maternal Grandfather     Prostate  . Diabetes Paternal Grandmother   . Diabetes Paternal Grandfather   . Heart disease Paternal Grandfather     Social History  Substance Use Topics  . Smoking status: Never Smoker  . Smokeless tobacco: Never Used  . Alcohol use 0.0 oz/week    No current facility-administered medications for this encounter.   Current Outpatient  Prescriptions:  .  norethindrone-ethinyl estradiol 1/35 (ALAYCEN 1/35) tablet, Take 1 tablet by mouth daily., Disp: 28 tablet, Rfl: 12 .  phenazopyridine (PYRIDIUM) 200 MG tablet, Take 1 tablet (200 mg total) by mouth 3 (three) times daily as needed for pain., Disp: 6 tablet, Rfl: 0  No Known Allergies   ROS  As noted in HPI.   Physical Exam  BP 138/80 (BP Location: Left Arm)   Pulse 88   Temp 98.9 F (37.2 C) (Oral)   Resp 18   Ht 5\' 10"  (1.778 m)   Wt 150 lb (68 kg)   SpO2 100%   BMI 21.52 kg/m   Constitutional: Well developed, well nourished, no acute distress Eyes:  EOMI, conjunctiva normal bilaterally HENT: Normocephalic, atraumatic,mucus membranes moist Respiratory: Normal inspiratory effort Cardiovascular: Normal rate GI: nondistended  GU: Deferred after discussing this with patient. skin: No rash, skin intact Musculoskeletal: no deformities Neurologic: Alert & oriented x 3, no focal neuro deficits Psychiatric: Speech and behavior appropriate   ED Course   Medications - No data to display  Orders Placed This Encounter  Procedures  . Urine culture    Standing Status:   Standing    Number of Occurrences:   1    Order Specific Question:   Patient immune status    Answer:   Normal  . Urinalysis, Complete w Microscopic    Standing Status:   Standing    Number of Occurrences:   1    Results for orders placed or performed during the hospital encounter of  08/04/16 (from the past 24 hour(s))  Urinalysis, Complete w Microscopic     Status: Abnormal   Collection Time: 08/04/16  5:50 PM  Result Value Ref Range   Color, Urine ORANGE (A) YELLOW   APPearance CLEAR CLEAR   Specific Gravity, Urine  1.005 - 1.030    TEST NOT REPORTED DUE TO COLOR INTERFERENCE OF URINE PIGMENT   pH  5.0 - 8.0    TEST NOT REPORTED DUE TO COLOR INTERFERENCE OF URINE PIGMENT   Glucose, UA (A) NEGATIVE mg/dL    TEST NOT REPORTED DUE TO COLOR INTERFERENCE OF URINE PIGMENT   Hgb urine  dipstick (A) NEGATIVE    TEST NOT REPORTED DUE TO COLOR INTERFERENCE OF URINE PIGMENT   Bilirubin Urine (A) NEGATIVE    TEST NOT REPORTED DUE TO COLOR INTERFERENCE OF URINE PIGMENT   Ketones, ur (A) NEGATIVE mg/dL    TEST NOT REPORTED DUE TO COLOR INTERFERENCE OF URINE PIGMENT   Protein, ur (A) NEGATIVE mg/dL    TEST NOT REPORTED DUE TO COLOR INTERFERENCE OF URINE PIGMENT   Nitrite (A) NEGATIVE    TEST NOT REPORTED DUE TO COLOR INTERFERENCE OF URINE PIGMENT   Leukocytes, UA (A) NEGATIVE    TEST NOT REPORTED DUE TO COLOR INTERFERENCE OF URINE PIGMENT   Squamous Epithelial / LPF 0-5 (A) NONE SEEN   WBC, UA 6-30 0 - 5 WBC/hpf   RBC / HPF 0-5 0 - 5 RBC/hpf   Bacteria, UA RARE (A) NONE SEEN   No results found.  ED Clinical Impression  Dysuria  Pyuria   ED Assessment/Plan  Unable to perform UA due to Azo. She does have pyuria, rare bacteria, a rbc's. Specimen is slightly contaminated.  Discussed doing a pelvic exam to rule out yeast, BV, Trichomonas. She opted to not have this done today. States does not feel like a gynecological infection. She declined to do gonorrhea or chlamydia testing and in the light of her partner's negative recent testing, think that this is reasonable.She states that she will await the urine culture results, try pushing fluids and take the pyridium and see how she does. Follow-up with her primary care physician or return here if not better with the Pyridium. Advised her that we will call in the appropriate antibiotics if her urine grows out a UTI. Pt provided working phone number. Follow-up with PMD as needed. Discussed labs, MDM, plan and followup with patient. Pt agrees with plan.   Meds ordered this encounter  Medications  . phenazopyridine (PYRIDIUM) 200 MG tablet    Sig: Take 1 tablet (200 mg total) by mouth 3 (three) times daily as needed for pain.    Dispense:  6 tablet    Refill:  0    *This clinic note was created using Scientist, clinical (histocompatibility and immunogenetics).  Therefore, there may be occasional mistakes despite careful proofreading.  ?    Domenick Gong, MD 08/04/16 1843

## 2016-08-06 LAB — URINE CULTURE: SPECIAL REQUESTS: NORMAL

## 2016-08-28 ENCOUNTER — Ambulatory Visit
Admission: EM | Admit: 2016-08-28 | Discharge: 2016-08-28 | Disposition: A | Discharge status: Home / Self Care | Payer: BLUE CROSS/BLUE SHIELD | Attending: Family Medicine | Admitting: Family Medicine

## 2016-08-28 DIAGNOSIS — N3000 Acute cystitis without hematuria: Secondary | ICD-10-CM

## 2016-08-28 DIAGNOSIS — N390 Urinary tract infection, site not specified: Secondary | ICD-10-CM

## 2016-08-28 LAB — URINALYSIS, COMPLETE (UACMP) WITH MICROSCOPIC
BACTERIA UA: NONE SEEN
Bilirubin Urine: NEGATIVE
GLUCOSE, UA: NEGATIVE mg/dL
Hgb urine dipstick: NEGATIVE
LEUKOCYTES UA: NEGATIVE
Nitrite: POSITIVE — AB
PH: 7 (ref 5.0–8.0)
Protein, ur: NEGATIVE mg/dL
SPECIFIC GRAVITY, URINE: 1.02 (ref 1.005–1.030)

## 2016-08-28 MED ORDER — SULFAMETHOXAZOLE-TRIMETHOPRIM 800-160 MG PO TABS: 1.0000 | ORAL_TABLET | Freq: Two times a day (BID) | ORAL | 0 refills | Status: DC

## 2016-08-28 MED ORDER — PHENAZOPYRIDINE HCL 200 MG PO TABS: 200.0000 mg | ORAL_TABLET | Freq: Three times a day (TID) | ORAL | 0 refills | Status: DC | PRN

## 2016-08-28 MED ORDER — FLUCONAZOLE 150 MG PO TABS: 150.0000 mg | ORAL_TABLET | Freq: Once | ORAL | 0 refills | Status: AC

## 2016-08-28 NOTE — ED Triage Notes (Addendum)
Pt c/o discomfort with urination. Frequency, and burning.

## 2016-08-28 NOTE — Discharge Instructions (Signed)
Recommend emptying bladder before coitius

## 2016-08-28 NOTE — ED Provider Notes (Signed)
[facility]    CSN: [id number] Arrival date & time: 08/28/16  1414     History   Chief Complaint Chief Complaint  Patient presents with  . Urinary Tract Infection    HPI Desiree Garrett is a 21 y.o. female.   Patient symptoms of UTI. She had a repeat evaluation March and April she had another UTI but that time. He did not have a pus cells actually do increase of fluids use from a zone things got better. Earlier this week this came back and progresses gotten worse. 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 She also has questions why she is getting recurrent UTIs. She states of doing so recurrent that she is almost afraid to go the bathroom and she feels find out she has another UTI. Only surgeries was T extraction no medical problems. She is a gravida 0 para 0. Father diabetes and asthma. She never smoked. No chronic medical problems.   The history is provided by the patient. No language interpreter was used.  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 No pertinent past medical history.  There are no active problems to display for this patient.   Past Surgical History:  Procedure Laterality Date  . WISDOM TOOTH EXTRACTION      OB History    Gravida Para Term Preterm AB Living   0             SAB TAB Ectopic Multiple Live Births                   Home Medications    Prior to Admission  medications   Medication Sig Start Date End Date Taking? Authorizing Provider  norethindrone-ethinyl estradiol 1/35 (ALAYCEN 1/35) tablet Take 1 tablet by mouth daily. 03/28/16  Yes Dara Lords, MD  fluconazole (DIFLUCAN) 150 MG tablet Take 1 tablet (150 mg total) by mouth once. 08/28/16 08/28/16  Hassan Rowan, MD  phenazopyridine (PYRIDIUM) 200 MG tablet Take 1 tablet (200 mg total) by mouth 3 (three) times daily as needed for pain. 08/28/16   Hassan Rowan, MD  sulfamethoxazole-trimethoprim (BACTRIM DS,SEPTRA DS) 800-160 MG tablet Take 1 tablet by mouth 2 (two) times daily. 08/28/16   Hassan Rowan, MD    Family History Family History  Problem Relation Age of Onset  . Diabetes Father   . Asthma Father   . Asthma Brother   . Cancer Maternal Grandfather     Prostate  . Diabetes Paternal Grandmother   . Diabetes Paternal Grandfather   . Heart disease Paternal Grandfather     Social History Social History  Substance Use Topics  . Smoking status: Never Smoker  . Smokeless tobacco: Never Used  . Alcohol use 0.0 oz/week     Allergies   Patient has no known allergies.   Review of Systems Review of Systems  Gastrointestinal: Negative for nausea.  Genitourinary: Positive for dysuria and urgency. Negative  for flank pain and vaginal discharge.     Physical Exam Triage Vital Signs ED Triage Vitals  Enc Vitals Group     BP 08/28/16 1443 116/73     Pulse Rate 08/28/16 1443 89     Resp 08/28/16 1443 16     Temp 08/28/16 1443 98.2 F (36.8 C)     Temp Source 08/28/16 1443 Oral     SpO2 08/28/16 1443 100 %     Weight 08/28/16 1434 150 lb (68 kg)     Height 08/28/16 1434  (1.778 m)     Head Circumference --      Peak Flow --      Pain Score 08/28/16 1434 5     Pain Loc --      Pain Edu? --      Excl. in GC? --    No data found.   Updated Vital Signs BP 116/73 (BP Location: Left Arm)   Pulse 89   Temp 98.2 F (36.8 C) (Oral)   Resp 16   Ht  (1.778 m)    Wt 150 lb (68 kg)   LMP 08/14/2016   SpO2 100%   BMI 21.52 kg/m   Visual Acuity Right Eye Distance:   Left Eye Distance:   Bilateral Distance:    Right Eye Near:   Left Eye Near:    Bilateral Near:     Physical Exam  Constitutional: She is oriented to person, place, and time. She appears well-developed and well-nourished.  HENT:  Head: Normocephalic and atraumatic.  Eyes: Conjunctivae are normal. Pupils are equal, round, and reactive to light.  Neck: Normal range of motion. Neck supple.  Pulmonary/Chest: Effort normal.  Abdominal: Soft. She exhibits no distension. There is no tenderness.  Genitourinary: Vaginal discharge found.  Musculoskeletal: Normal range of motion. She exhibits no edema or deformity.  Neurological: She is alert and oriented to person, place, and time. No cranial nerve deficit.  Skin: Skin is warm.  Vitals reviewed.    UC Treatments / Results  Labs (all labs ordered are listed, but only abnormal results are displayed) Labs Reviewed  URINALYSIS, COMPLETE (UACMP) WITH MICROSCOPIC - Abnormal; Notable for the following:       Result Value   Ketones, ur TRACE (*)    Nitrite POSITIVE (*)    Squamous Epithelial / LPF 0-5 (*)    All other components within normal limits  URINE CULTURE    EKG  EKG Interpretation None       Radiology No results found.  Procedures Procedures (including critical care time)  Medications Ordered in UC Medications - No data to display  Results for orders placed or performed during the hospital encounter of 08/28/16  Urinalysis, Complete w Microscopic  Result Value Ref Range   Color, Urine YELLOW YELLOW   APPearance CLEAR CLEAR   Specific Gravity, Urine 1.020 1.005 - 1.030   pH 7.0 5.0 - 8.0   Glucose, UA NEGATIVE NEGATIVE mg/dL   Hgb urine dipstick NEGATIVE NEGATIVE   Bilirubin Urine NEGATIVE NEGATIVE   Ketones, ur TRACE (A) NEGATIVE mg/dL   Protein, ur NEGATIVE NEGATIVE mg/dL   Nitrite POSITIVE (A)  NEGATIVE   Leukocytes, UA NEGATIVE NEGATIVE   Squamous Epithelial / LPF 0-5 (A) NONE SEEN   WBC, UA 6-30 0 - 5 WBC/hpf   RBC / HPF 0-5 0 - 5 RBC/hpf   Bacteria, UA NONE SEEN NONE SEEN   Initial Impression / Assessment and Plan /  UC Course  I have reviewed the triage vital signs and the nursing notes.  Pertinent labs & imaging results that were available during my care of the patient were reviewed by me and considered in my medical decision making (see chart for details).     We discussed the need to void before sexual relations in the future. To this hopefully will make a difference in regards to her recurrent UTIs. PH is 7 was 5 earlier this week when she tests her urine were going to place on Septra DS 1 tablet twice a day for 7 days Pyridium 200 mg for 5 days and she does get yeast infection so we'll place on Diflucan No. 2 1 now and repeat a week if needed work note given for today as well.   Final Clinical Impressions(s) / UC Diagnoses   Final diagnoses:  Lower urinary tract infectious disease  Acute cystitis without hematuria    New Prescriptions Discharge Medication List as of 08/28/2016  3:26 PM    START taking these medications   Details  fluconazole (DIFLUCAN) 150 MG tablet Take 1 tablet (150 mg total) by mouth once., Starting Thu 08/28/2016, Normal    phenazopyridine (PYRIDIUM) 200 MG tablet Take 1 tablet (200 mg total) by mouth 3 (three) times daily as needed for pain., Starting Thu 08/28/2016, Normal    sulfamethoxazole-trimethoprim (BACTRIM DS,SEPTRA DS) 800-160 MG tablet Take 1 tablet by mouth 2 (two) times daily., Starting Thu 08/28/2016, Normal         Note: This dictation was prepared with Dragon dictation along with smaller phrase technology. Any transcriptional errors that result from this process are unintentional.   Hassan Rowan, MD 08/28/16 1734

## 2016-09-01 LAB — URINE CULTURE
Culture: >=100000 — AB
Special Requests: NORMAL

## 2016-12-04 ENCOUNTER — Telehealth: Payer: Self-pay

## 2016-12-04 NOTE — Telephone Encounter (Signed)
I would recommend any of the urologists at [facility].  I hate to specify a specific doctor as that may limit when she can see them. This is a pretty basic issue for them and any of the urologists should be able to deal with this.

## 2016-12-04 NOTE — Telephone Encounter (Signed)
Patient wants you to recommend a urologist for her to see. Been having some urinary infection problems (see Epic visits with other providers.)

## 2016-12-08 NOTE — Telephone Encounter (Signed)
I called patient but mailbox is full and I cannot leave a message.

## 2016-12-08 NOTE — Telephone Encounter (Signed)
Unable to leave voicemail, pt voicemail not set up.

## 2017-03-27 ENCOUNTER — Other Ambulatory Visit: Payer: Self-pay | Admitting: Gynecology

## 2017-03-27 NOTE — Telephone Encounter (Signed)
Annual on 04/20/17

## 2017-04-20 ENCOUNTER — Encounter: Payer: 59 | Admitting: Gynecology

## 2017-04-22 ENCOUNTER — Telehealth: Payer: Self-pay | Admitting: *Deleted

## 2017-04-22 MED ORDER — NORETHINDRONE-ETH ESTRADIOL 1-35 MG-MCG PO TABS: 1.0000 | ORAL_TABLET | Freq: Every day | ORAL | 1 refills | Status: DC

## 2017-04-22 NOTE — Telephone Encounter (Signed)
Patient has annual re-scheduled on 06/04/17, due to weather, needs refill on birth control pills until then Rx sent.

## 2017-06-04 ENCOUNTER — Encounter: Payer: Self-pay | Admitting: Gynecology

## 2017-06-04 ENCOUNTER — Ambulatory Visit (INDEPENDENT_AMBULATORY_CARE_PROVIDER_SITE_OTHER): Payer: 59 | Admitting: Gynecology

## 2017-06-04 VITALS — BP 120/76 | Ht 69.0 in | Wt 160.0 lb

## 2017-06-04 DIAGNOSIS — Z01419 Encounter for gynecological examination (general) (routine) without abnormal findings: Secondary | ICD-10-CM

## 2017-06-04 DIAGNOSIS — N3010 Interstitial cystitis (chronic) without hematuria: Secondary | ICD-10-CM | POA: Diagnosis not present

## 2017-06-04 MED ORDER — NORETHINDRONE-ETH ESTRADIOL 1-35 MG-MCG PO TABS: 1.0000 | ORAL_TABLET | Freq: Every day | ORAL | 12 refills | Status: DC

## 2017-06-04 NOTE — Patient Instructions (Signed)
Follow-up in 1 year for annual exam, sooner if any issues. 

## 2017-06-04 NOTE — Progress Notes (Signed)
    Desiree BerlinJessie L Garrett [birth year] [id number]        22 y.o.  G0P0 for annual gynecologic exam.  Recently diagnosed with interstitial cystitis.  Is doing much better now that she is made dietary changes.  Past medical history,surgical history, problem list, medications, allergies, family history and social history were all reviewed and documented as reviewed in the EPIC chart.  ROS:  Performed with pertinent positives and negatives included in the history, assessment and plan.   Additional significant findings : None   Exam: Kennon PortelaKim Gardner assistant Vitals:   06/04/17 1018  BP: 120/76  Weight: 160 lb (72.6 kg)  Height: 5\' 9"  (1.753 m)   Body mass index is 23.63 kg/m.  General appearance:  Normal affect, orientation and appearance. Skin: Grossly normal HEENT: Without gross lesions.  No cervical or supraclavicular adenopathy. Thyroid normal.  Lungs:  Clear without wheezing, rales or rhonchi Cardiac: RR, without RMG Abdominal:  Soft, nontender, without masses, guarding, rebound, organomegaly or hernia Breasts:  Examined lying and sitting without masses, retractions, discharge or axillary adenopathy. Pelvic:  Ext, BUS, Vagina: Normal  Cervix: Normal  Uterus: Anteverted, normal size, shape and contour, midline and mobile nontender   Adnexa: Without masses or tenderness    Anus and perineum: Normal     Assessment/Plan:  22 y.o. G0P0 female for annual gynecologic exam her menses, oral contraceptives..   1. Oral contraceptives.  Doing well on her Ortho-Novum 1/35 equivalent.  Wants to continue.  Refill times 1 year provided. 2. STD screening offered and declined. 3. Gardasil series never.  I again discussed Gardasil series and recommendations to receive the vaccine.  She acknowledges my recommendations but is not willing to initiate now. 4. Breast health.  SBE monthly reviewed.  Breast exam normal today. 5. Pap smear 2017.  No Pap smear done today.  Plan follow-up Pap smear at 3-year  interval per current screening guidelines. 6. Interstitial cystitis.  Recently diagnosed.  Is doing much better now that she is made dietary changes and avoiding bladder irritants.  She will follow-up with urology as needed.  Check baseline urine analysis today. 7. Health maintenance.  Requests baseline labs.  Baseline CBC, comprehensive metabolic panel and urine analysis ordered.  Follow-up in 1 year, sooner as needed.   Dara Lordsimothy P Naysa Puskas MD, 10:48 AM 06/04/2017

## 2017-06-05 LAB — COMPREHENSIVE METABOLIC PANEL
AG Ratio: 1.7 (calc) (ref 1.0–2.5)
ALT: 22 U/L (ref 6–29)
AST: 18 U/L (ref 10–30)
Albumin: 4.3 g/dL (ref 3.6–5.1)
Alkaline phosphatase (APISO): 42 U/L (ref 33–115)
BUN: 11 mg/dL (ref 7–25)
CO2: 26 mmol/L (ref 20–32)
CREATININE: 0.74 mg/dL (ref 0.50–1.10)
Calcium: 9.5 mg/dL (ref 8.6–10.2)
Chloride: 105 mmol/L (ref 98–110)
GLUCOSE: 103 mg/dL — AB (ref 65–99)
Globulin: 2.6 g/dL (calc) (ref 1.9–3.7)
Potassium: 4.1 mmol/L (ref 3.5–5.3)
SODIUM: 138 mmol/L (ref 135–146)
TOTAL PROTEIN: 6.9 g/dL (ref 6.1–8.1)
Total Bilirubin: 0.3 mg/dL (ref 0.2–1.2)

## 2017-06-05 LAB — CBC WITH DIFFERENTIAL/PLATELET
Basophils Absolute: 41 cells/uL (ref 0–200)
Basophils Relative: 0.8 %
EOS PCT: 7.9 %
Eosinophils Absolute: 403 cells/uL (ref 15–500)
HCT: 37.9 % (ref 35.0–45.0)
Hemoglobin: 12.7 g/dL (ref 11.7–15.5)
Lymphs Abs: 2086 cells/uL (ref 850–3900)
MCH: 28.3 pg (ref 27.0–33.0)
MCHC: 33.5 g/dL (ref 32.0–36.0)
MCV: 84.6 fL (ref 80.0–100.0)
MPV: 10.2 fL (ref 7.5–12.5)
Monocytes Relative: 6.9 %
NEUTROS PCT: 43.5 %
Neutro Abs: 2219 cells/uL (ref 1500–7800)
PLATELETS: 292 10*3/uL (ref 140–400)
RBC: 4.48 10*6/uL (ref 3.80–5.10)
RDW: 12.7 % (ref 11.0–15.0)
TOTAL LYMPHOCYTE: 40.9 %
WBC mixed population: 352 cells/uL (ref 200–950)
WBC: 5.1 10*3/uL (ref 3.8–10.8)

## 2017-06-06 LAB — URINALYSIS, COMPLETE W/RFL CULTURE
BILIRUBIN URINE: NEGATIVE
Bacteria, UA: NONE SEEN /HPF
Glucose, UA: NEGATIVE
HGB URINE DIPSTICK: NEGATIVE
Hyaline Cast: NONE SEEN /LPF
KETONES UR: NEGATIVE
LEUKOCYTE ESTERASE: NEGATIVE
NITRITES URINE, INITIAL: NEGATIVE
Protein, ur: NEGATIVE
RBC / HPF: NONE SEEN /HPF (ref 0–2)
SPECIFIC GRAVITY, URINE: 1.022 (ref 1.001–1.03)
WBC UA: NONE SEEN /HPF (ref 0–5)
pH: 7 (ref 5.0–8.0)

## 2017-06-06 LAB — URINE CULTURE
MICRO NUMBER:: 90102350
SPECIMEN QUALITY:: ADEQUATE

## 2017-06-06 LAB — NO CULTURE INDICATED

## 2017-09-12 ENCOUNTER — Ambulatory Visit
Admission: EM | Admit: 2017-09-12 | Discharge: 2017-09-12 | Disposition: A | Discharge status: Home / Self Care | Payer: 59 | Attending: Family Medicine | Admitting: Family Medicine

## 2017-09-12 ENCOUNTER — Encounter: Payer: Self-pay | Admitting: Gynecology

## 2017-09-12 ENCOUNTER — Other Ambulatory Visit: Payer: Self-pay

## 2017-09-12 ENCOUNTER — Ambulatory Visit (INDEPENDENT_AMBULATORY_CARE_PROVIDER_SITE_OTHER): Payer: 59

## 2017-09-12 DIAGNOSIS — W5501XA Bitten by cat, initial encounter: Secondary | ICD-10-CM | POA: Diagnosis not present

## 2017-09-12 DIAGNOSIS — S61032A Puncture wound without foreign body of left thumb without damage to nail, initial encounter: Secondary | ICD-10-CM | POA: Diagnosis not present

## 2017-09-12 MED ORDER — AMOXICILLIN-POT CLAVULANATE 875-125 MG PO TABS: 1.0000 | ORAL_TABLET | Freq: Two times a day (BID) | ORAL | 0 refills | Status: DC

## 2017-09-12 MED ORDER — FLUCONAZOLE 150 MG PO TABS: 150.0000 mg | ORAL_TABLET | Freq: Every day | ORAL | 0 refills | Status: DC

## 2017-09-12 MED ORDER — MUPIROCIN 2 % EX OINT: TOPICAL_OINTMENT | CUTANEOUS | 0 refills | Status: DC

## 2017-09-12 MED ORDER — SULFAMETHOXAZOLE-TRIMETHOPRIM 800-160 MG PO TABS: 1.0000 | ORAL_TABLET | Freq: Two times a day (BID) | ORAL | 0 refills | Status: AC

## 2017-09-12 MED ORDER — CEFTRIAXONE SODIUM 1 G IJ SOLR
1.0000 g | Freq: Once | INTRAMUSCULAR | Status: AC
  Administered 2017-09-12: 1 g via INTRAMUSCULAR

## 2017-09-12 NOTE — ED Triage Notes (Signed)
Per patient c/o cat bite at work x yesterday. Per patient work at [employer]. Pt. Stated she had her tetanus vaccine and the cat is up to date on it rabies vaccine.

## 2017-09-12 NOTE — ED Provider Notes (Signed)
[facility] ____________________________________________  Time seen: Approximately 12:34 PM  I have reviewed the triage vital signs and the nursing notes.   HISTORY  Chief Complaint Animal Bite   HPI Desiree Garrett is a 22 y.o. female presented for evaluation of cat bite to the left thumb that occurred yesterday afternoon around 330.  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  Patient reports it was an old sick cat that bit x2 to left thumb while they were drawing labs.  Reports that same cat was then later euthanized due to organ failure.  Reports the cat was up-to-date on immunizations, including rabies immunization.  Reports she is also up-to-date on tetanus immunization, last tetanus imitation was last year.  States she has had pain to left thumb since the incident, but the pain has gradually increased with associated increase of swelling.  Reports today has noticed some purulent drainage from the thumb wound.  Reports otherwise feels well.  No other injuries.  Denies fevers.  Reports continues eating drink well.  Has been applying topical antibiotic as well as soaking the thumb without any changes. Denies chest pain, shortness of breath, abdominal pain. Denies recent sickness. Denies recent antibiotic use.   Patient denies personal history of MRSA, however she has had abscesses in the past as well as her boyfriend positive for MRSA.  Duanne Limerick, MD: PCP  Patient's last menstrual period was 09/08/2017.Denies pregnancy.   Past Medical History:  Diagnosis Date  . Eczema   . Interstitial cystitis     There are no active problems to display for this patient.   Past Surgical History:  Procedure Laterality Date  . WISDOM TOOTH EXTRACTION       No current facility-administered medications for this encounter.   Current Outpatient Medications:  .  norethindrone-ethinyl estradiol 1/35 (ALAYCEN 1/35) tablet, Take 1 tablet by mouth daily., Disp:  28 tablet, Rfl: 12 .  amoxicillin-clavulanate (AUGMENTIN) 875-125 MG tablet, Take 1 tablet by mouth every 12 (twelve) hours., Disp: 20 tablet, Rfl: 0 .  fluconazole (DIFLUCAN) 150 MG tablet, Take 1 tablet (150 mg total) by mouth daily. Take one pill orally, then Repeat in 72hrs as needed., Disp: 2 tablet, Rfl: 0 .  mupirocin ointment (BACTROBAN) 2 %, Apply two times a day for 10 days., Disp: 22 g, Rfl: 0 .  sulfamethoxazole-trimethoprim (BACTRIM DS,SEPTRA DS) 800-160 MG tablet, Take 1 tablet by mouth 2 (two) times daily for 10 days., Disp: 20 tablet, Rfl: 0  Allergies Patient has no known allergies.  Family History  Problem Relation Age of Onset  . Diabetes Father   . Asthma Father   . Asthma Brother   . Cancer Maternal Grandfather        Prostate  . Diabetes Paternal Grandmother   . Kidney disease Paternal Grandmother   . Diabetes Paternal Grandfather   . Heart disease Paternal Grandfather   . Thyroid disease Mother     Social History Social History   Tobacco Use  . Smoking status: Never Smoker  . Smokeless tobacco: Never Used  Substance Use Topics  . Alcohol use: Yes    Alcohol/week: 0.0 oz  . Drug use: No    Review of Systems Constitutional: No fever/chills Cardiovascular: Denies chest pain. Respiratory: Denies shortness of breath. Gastrointestinal: No abdominal pain.  Musculoskeletal: Negative for back pain. Skin: As above.    ____________________________________________   PHYSICAL EXAM:  VITAL SIGNS: ED Triage Vitals  Enc Vitals Group  BP 09/12/17 1149 126/82     Pulse Rate 09/12/17 1149 85     Resp 09/12/17 1149 16     Temp 09/12/17 1149 98.3 F (36.8 C)     Temp Source 09/12/17 1149 Oral     SpO2 09/12/17 1149 100 %     Weight 09/12/17 1147 160 lb (72.6 kg)     Height 09/12/17 1147  (1.778 m)     Head Circumference --      Peak Flow --      Pain Score 09/12/17 1147 7     Pain Loc --      Pain Edu? --      Excl. in GC? --      Constitutional: Alert and oriented. Well appearing and in no acute distress. ENT      Head: Normocephalic and atraumatic. Cardiovascular: Normal rate, regular rhythm. Grossly normal heart sounds.  Good peripheral circulation. Respiratory: Normal respiratory effort without tachypnea nor retractions. Breath sounds are clear and equal bilaterally. No wheezes, rales, rhonchi. Musculoskeletal:  Steady gait. Bilateral distal radial pulses equal and easily palpated.See below. Neurologic:  Normal speech and language. No gross focal neurologic deficits are appreciated. Speech is normal. No gait instability.  Skin:  Skin is warm, dry.   Except: Left thumb volar aspect wound as above with x3 less than 0.5 cm puncture wounds, patient actively express purulent drainage, with surrounding erythema, erythema noncircumferential, edema localized to the volar aspect, mild pain with distal phalanx flexion, but full range of motion present with good resisted flexion and extension, normal sensation and capillary refill, left hand otherwise nontender. Psychiatric: Mood and affect are normal. Speech and behavior are normal. Patient exhibits appropriate insight and judgment   ___________________________________________   LABS (all labs ordered are listed, but only abnormal results are displayed)  Labs Reviewed  AEROBIC CULTURE (SUPERFICIAL SPECIMEN)    RADIOLOGY  Dg Finger Thumb Left  Result Date: 09/12/2017 CLINICAL DATA:  Cat bite to palmar aspect of left thumb EXAM: LEFT THUMB 2+V COMPARISON:  None. FINDINGS: There is no evidence of fracture or dislocation. There is no evidence of arthropathy or other focal bone abnormality. Mild soft tissue swelling at the palmar aspect of the left thumb. IMPRESSION: No evidence of osseous abnormality. Electronically Signed   By: Ted Mcalpine M.D.   On: 09/12/2017 12:42   ____________________________________________   PROCEDURES Procedures   INITIAL  IMPRESSION / ASSESSMENT AND PLAN / ED COURSE  Pertinent labs & imaging results that were available during my care of the patient were reviewed by me and considered in my medical decision making (see chart for details).  Well-appearing patient.  No acute distress.  Left thumb cat bite that occurred yesterday, during suspected provoked attack.  Patient reports rabies imitation up-to-date as well as her tetanus immunization status.  Left thumb wound with secondary infection.  We will plan to treat with oral Augmentin as well as 1 g Rocephin given in office, as well as topical Bactroban.  Some concern of MRSA as patient with history of abscesses and positive MRSA history of boyfriend, patient states that she does not tolerate doxycycline well and wants to avoid this.  Will place patient on oral Augmentin as well as Bactrim.  Left thumb x-ray as above per radiologist, mild soft tissue swelling, no acute osseous abnormality.  Animal control notified by RN and cat bite reported, [place] police on site in urgent care file.  Discussed very strict follow-up and return parameter, recommend  wound check in 1 to 2 days.  For any fever, increased swelling, increased pain or decreased ability to flex, proceed directly to the emergency room.  Patient also requests Diflucan due to side effect yeast vaginitis of antibiotics, Rx given.  Discussed indication, risks and benefits of medications with patient.  Wound culture also obtained.  Discussed follow up with Primary care physician this week. Discussed follow up and return parameters including no resolution or any worsening concerns. Patient verbalized understanding and agreed to plan.   ____________________________________________   FINAL CLINICAL IMPRESSION(S) / ED DIAGNOSES  Final diagnoses:  Cat bite, initial encounter  Puncture wound of left thumb with complication, initial encounter     ED Discharge Orders        Ordered    amoxicillin-clavulanate  (AUGMENTIN) 875-125 MG tablet  Every 12 hours     09/12/17 1256    sulfamethoxazole-trimethoprim (BACTRIM DS,SEPTRA DS) 800-160 MG tablet  2 times daily     09/12/17 1256    fluconazole (DIFLUCAN) 150 MG tablet  Daily     09/12/17 1256    mupirocin ointment (BACTROBAN) 2 %     09/12/17 1256       Note: This dictation was prepared with Dragon dictation along with smaller phrase technology. Any transcriptional errors that result from this process are unintentional.         Renford Dills, NP 09/12/17 1309

## 2017-09-12 NOTE — Discharge Instructions (Addendum)
Take medication as prescribed. Rest. Drink plenty of fluids. Keep clean. Monitor closely.   As discussed return to urgent care in 1 to 2 days for wound check.   Follow up with your primary care physician this week as needed. Return to Urgent care as needed.  Increased pain, decreased range of motion, fever or worsening concerns proceed directly to the emergency department.

## 2017-09-15 LAB — AEROBIC CULTURE W GRAM STAIN (SUPERFICIAL SPECIMEN)

## 2018-06-10 ENCOUNTER — Ambulatory Visit (INDEPENDENT_AMBULATORY_CARE_PROVIDER_SITE_OTHER): Payer: 59 | Admitting: Gynecology

## 2018-06-10 ENCOUNTER — Encounter: Payer: Self-pay | Admitting: Gynecology

## 2018-06-10 VITALS — BP 118/72 | Ht 69.0 in | Wt 161.0 lb

## 2018-06-10 DIAGNOSIS — Z13220 Encounter for screening for lipoid disorders: Secondary | ICD-10-CM | POA: Diagnosis not present

## 2018-06-10 DIAGNOSIS — Z01419 Encounter for gynecological examination (general) (routine) without abnormal findings: Secondary | ICD-10-CM

## 2018-06-10 MED ORDER — NORETHINDRONE-ETH ESTRADIOL 1-35 MG-MCG PO TABS: 1.0000 | ORAL_TABLET | Freq: Every day | ORAL | 12 refills | Status: DC

## 2018-06-10 NOTE — Progress Notes (Signed)
    KARMON FOLK Apr 23, 1996 [id number]        23 y.o.  G0P0 for annual gynecologic exam.  Without gynecologic complaints  Past medical history,surgical history, problem list, medications, allergies, family history and social history were all reviewed and documented as reviewed in the EPIC chart.  ROS:  Performed with pertinent positives and negatives included in the history, assessment and plan.   Additional significant findings : None   Exam: Kennon Portela assistant Vitals:   06/10/18 0841  BP: 118/72  Weight: 161 lb (73 kg)  Height: 5\' 9"  (1.753 m)   Body mass index is 23.78 kg/m.  General appearance:  Normal affect, orientation and appearance. Skin: Grossly normal HEENT: Without gross lesions.  No cervical or supraclavicular adenopathy. Thyroid normal.  Lungs:  Clear without wheezing, rales or rhonchi Cardiac: RR, without RMG Abdominal:  Soft, nontender, without masses, guarding, rebound, organomegaly or hernia Breasts:  Examined lying and sitting without masses, retractions, discharge or axillary adenopathy. Pelvic:  Ext, BUS, Vagina: Normal  Cervix: Normal  Uterus: Anteverted, normal size, shape and contour, midline and mobile nontender   Adnexa: Without masses or tenderness    Anus and perineum: Normal    Assessment/Plan:  23 y.o. G0P0 female for annual gynecologic exam.  With light spotty menses, oral contraceptives  1. Oral contraceptives.  Continues on Ortho-Novum 1/35 equivalent.  Will have spotting during her pill free week but no other bleeding.  Refill x1 year provided. 2. STD screening discussed, offered and declined. 3. Rediscussed Gardasil vaccine and encouraged the patient to start the series. 4. Breast exam normal today.  SBE monthly reviewed. 5. Pap smear 2017.  Pap smear done today.  Continue with 3-year interval per current screening guidelines. 6. Health maintenance.  Baseline CBC, CMP and lipid profile ordered.  Follow-up 1 year, sooner as  needed.   Dara Lords MD, 9:00 AM 06/10/2018

## 2018-06-10 NOTE — Addendum Note (Signed)
Addended by: Dayna Barker on: 06/10/2018 09:07 AM   Modules accepted: Orders

## 2018-06-10 NOTE — Patient Instructions (Signed)
Consider the Gardasil vaccine.  Follow-up in 1 year for annual exam. 

## 2018-06-11 ENCOUNTER — Other Ambulatory Visit: Payer: Self-pay

## 2018-06-11 LAB — LIPID PANEL
CHOL/HDL RATIO: 3.8 (calc) (ref <5.0)
Cholesterol: 196 mg/dL (ref <200)
HDL: 52 mg/dL (ref >50)
LDL CHOLESTEROL (CALC): 127 mg/dL — AB
Non-HDL Cholesterol (Calc): 144 mg/dL (calc) — ABNORMAL HIGH (ref <130)
Triglycerides: 74 mg/dL (ref <150)

## 2018-06-11 LAB — COMPREHENSIVE METABOLIC PANEL
AG Ratio: 1.6 (calc) (ref 1.0–2.5)
ALBUMIN MSPROF: 4.4 g/dL (ref 3.6–5.1)
ALKALINE PHOSPHATASE (APISO): 44 U/L (ref 33–115)
ALT: 33 U/L — ABNORMAL HIGH (ref 6–29)
AST: 19 U/L (ref 10–30)
BILIRUBIN TOTAL: 0.4 mg/dL (ref 0.2–1.2)
BUN: 8 mg/dL (ref 7–25)
CALCIUM: 9.6 mg/dL (ref 8.6–10.2)
CHLORIDE: 106 mmol/L (ref 98–110)
CO2: 25 mmol/L (ref 20–32)
CREATININE: 0.76 mg/dL (ref 0.50–1.10)
GLOBULIN: 2.8 g/dL (ref 1.9–3.7)
Glucose, Bld: 81 mg/dL (ref 65–99)
POTASSIUM: 4 mmol/L (ref 3.5–5.3)
SODIUM: 139 mmol/L (ref 135–146)
TOTAL PROTEIN: 7.2 g/dL (ref 6.1–8.1)

## 2018-06-11 LAB — CBC WITH DIFFERENTIAL/PLATELET
ABSOLUTE MONOCYTES: 402 {cells}/uL (ref 200–950)
Basophils Absolute: 40 cells/uL (ref 0–200)
Basophils Relative: 0.6 %
EOS PCT: 3 %
Eosinophils Absolute: 201 cells/uL (ref 15–500)
HCT: 38.3 % (ref 35.0–45.0)
Hemoglobin: 12.9 g/dL (ref 11.7–15.5)
Lymphs Abs: 1856 cells/uL (ref 850–3900)
MCH: 28.7 pg (ref 27.0–33.0)
MCHC: 33.7 g/dL (ref 32.0–36.0)
MCV: 85.3 fL (ref 80.0–100.0)
MPV: 10.3 fL (ref 7.5–12.5)
Monocytes Relative: 6 %
NEUTROS PCT: 62.7 %
Neutro Abs: 4201 cells/uL (ref 1500–7800)
Platelets: 314 10*3/uL (ref 140–400)
RBC: 4.49 10*6/uL (ref 3.80–5.10)
RDW: 12.8 % (ref 11.0–15.0)
Total Lymphocyte: 27.7 %
WBC: 6.7 10*3/uL (ref 3.8–10.8)

## 2018-06-11 LAB — PAP IG W/ RFLX HPV ASCU

## 2018-06-26 ENCOUNTER — Other Ambulatory Visit: Payer: Self-pay

## 2018-06-26 ENCOUNTER — Ambulatory Visit
Admission: EM | Admit: 2018-06-26 | Discharge: 2018-06-26 | Disposition: A | Discharge status: Home / Self Care | Payer: 59 | Attending: Emergency Medicine | Admitting: Emergency Medicine

## 2018-06-26 ENCOUNTER — Encounter: Payer: Self-pay | Admitting: Emergency Medicine

## 2018-06-26 DIAGNOSIS — J029 Acute pharyngitis, unspecified: Secondary | ICD-10-CM

## 2018-06-26 LAB — RAPID STREP SCREEN (MED CTR MEBANE ONLY): STREPTOCOCCUS, GROUP A SCREEN (DIRECT): NEGATIVE

## 2018-06-26 NOTE — ED Provider Notes (Signed)
[facility]    CSN: [id number] Arrival date & time: 06/26/18  0820     History   Chief Complaint Chief Complaint  Patient presents with  . Sore Throat    HPI Desiree Garrett is a 23 y.o. female.   HPI  -year-old female presents with a sore throat that started last night.  He was doing fine until she ate a piece of salmon and thinks that 1 of the pin bones been caught in the right side of her throat.  It feels irritated.  Been using her finger to lower the area and has not felt any sharp foreign object.  Most of the pain appears to be on the inferior portion of the tonsil.  Had no fever or chills.  She gargled with salt water see if it would burn indicating a small laceration but this did not cause any problems.  No breathing difficulty does not exhibit any stridor.  Swallow without difficulty.       Past Medical History:  Diagnosis Date  . Eczema   . Interstitial cystitis     There are no active problems to display for this patient.   Past Surgical History:  Procedure Laterality Date  . WISDOM TOOTH EXTRACTION      OB History    Gravida  0   Para      Term      Preterm      AB      Living        SAB      TAB      Ectopic      Multiple      Live Births               Home Medications    Prior to Admission medications   Medication Sig Start Date End Date Taking? Authorizing Provider  norethindrone-ethinyl estradiol 1/35 (ALAYCEN 1/35) tablet Take 1 tablet by mouth daily. 06/10/18  Yes Fontaine, Nadyne Coombes, MD    Family History Family History  Problem Relation Age of Onset  . Diabetes Father   . Asthma Father   . Asthma Brother   . Cancer Maternal Grandfather        Prostate  . Diabetes Paternal Grandmother   . Kidney disease Paternal Grandmother   . Diabetes Paternal Grandfather   . Heart disease Paternal Grandfather   . Thyroid disease Mother     Social History Social History   Tobacco Use  . Smoking status:  Never Smoker  . Smokeless tobacco: Never Used  Substance Use Topics  . Alcohol use: Yes    Alcohol/week: 0.0 standard drinks    Comment: Rare  . Drug use: No     Allergies   Patient has no known allergies.   Review of Systems Review of Systems  Constitutional: Negative for activity change, appetite change, chills, fatigue and fever.  HENT: Positive for sore throat.   Respiratory: Negative for cough.   All other systems reviewed and are negative.    Physical Exam Triage Vital Signs ED Triage Vitals  Enc Vitals Group     BP 06/26/18 0833 112/69     Pulse Rate 06/26/18 0833 67     Resp 06/26/18 0833 14     Temp 06/26/18 0833 98.1 F (36.7 C)     Temp Source 06/26/18 0833 Oral     SpO2 06/26/18 0833 100 %     Weight 06/26/18 0830 160 lb (72.6 kg)  Height 06/26/18 0830 5\' 10"  (1.778 m)     Head Circumference --      Peak Flow --      Pain Score 06/26/18 0830 4     Pain Loc --      Pain Edu? --      Excl. in GC? --    No data found.  Updated Vital Signs BP 112/69 (BP Location: Left Arm)   Pulse 67   Temp 98.1 F (36.7 C) (Oral)   Resp 14   Ht 5\' 10"  (1.778 m)   Wt 160 lb (72.6 kg)   LMP 06/06/2018 (Approximate)   SpO2 100%   BMI 22.96 kg/m   Visual Acuity Right Eye Distance:   Left Eye Distance:   Bilateral Distance:    Right Eye Near:   Left Eye Near:    Bilateral Near:     Physical Exam Vitals signs and nursing note reviewed.  Constitutional:      General: She is not in acute distress.    Appearance: She is well-developed and normal weight. She is not ill-appearing, toxic-appearing or diaphoretic.  HENT:     Head: Normocephalic and atraumatic.     Right Ear: Tympanic membrane and ear canal normal. No swelling or tenderness.     Left Ear: Tympanic membrane and ear canal normal. No swelling or tenderness.     Nose: No congestion or rhinorrhea.     Mouth/Throat:     Mouth: Mucous membranes are moist. No oral lesions.     Pharynx: Oropharynx  is clear. Uvula midline. No pharyngeal swelling, oropharyngeal exudate, posterior oropharyngeal erythema or uvula swelling.     Tonsils: Swelling: 2+ on the right. 1+ on the left.  Eyes:     Conjunctiva/sclera: Conjunctivae normal.  Neck:     Musculoskeletal: Normal range of motion and neck supple.  Pulmonary:     Effort: Pulmonary effort is normal.     Breath sounds: Normal breath sounds.  Lymphadenopathy:     Cervical: No cervical adenopathy.  Skin:    General: Skin is warm and dry.  Neurological:     General: No focal deficit present.     Mental Status: She is alert and oriented to person, place, and time.  Psychiatric:        Mood and Affect: Mood normal.        Behavior: Behavior normal.      UC Treatments / Results  Labs (all labs ordered are listed, but only abnormal results are displayed) Labs Reviewed  RAPID STREP SCREEN ([facility] ONLY)  CULTURE, GROUP A STREP [facility])    EKG None  Radiology No results found.  Procedures Procedures (including critical care time)  Medications Ordered in UC Medications - No data to display  Initial Impression / Assessment and Plan / UC Course  I have reviewed the triage vital signs and the nursing notes.  Pertinent labs & imaging results that were available during my care of the patient were reviewed by me and considered in my medical decision making (see chart for details).    I have told the patient that I do not see any evidence of foreign body on right side.  Right tonsil is mildly enlarged in comparison to the left.  There are no lacerations abrasions  seen either.  She does have tenderness exteriorly on the throat no significant lymphadenopathy appreciated.  Patient was reassured.  She agreed soft tissue x-rays are not necessary today.  She continues to  have difficulty , she may go to the emergency room or go to [facility] early next week.   Final Clinical Impressions(s) / UC Diagnoses    Final diagnoses:  Sore throat   Discharge Instructions   None    ED Prescriptions    None     Controlled Substance Prescriptions [place] Controlled Substance Registry consulted? Not Applicable   Lutricia Feil, PA-C 06/26/18 1975

## 2018-06-26 NOTE — ED Triage Notes (Signed)
Patient c/o sore throat that started last night.  Patient denies fevers.  

## 2018-06-29 LAB — CULTURE, GROUP A STREP (THRC)

## 2018-07-02 ENCOUNTER — Telehealth (HOSPITAL_COMMUNITY): Payer: Self-pay | Admitting: Emergency Medicine

## 2018-07-02 NOTE — Telephone Encounter (Signed)
Attempted to reach patient to see how she was doing. No answer at this time.

## 2018-09-08 ENCOUNTER — Encounter: Payer: Self-pay | Admitting: Emergency Medicine

## 2018-09-08 ENCOUNTER — Ambulatory Visit
Admission: EM | Admit: 2018-09-08 | Discharge: 2018-09-08 | Disposition: A | Discharge status: Home / Self Care | Payer: 59 | Attending: Emergency Medicine | Admitting: Emergency Medicine

## 2018-09-08 ENCOUNTER — Other Ambulatory Visit: Payer: Self-pay

## 2018-09-08 DIAGNOSIS — L03113 Cellulitis of right upper limb: Secondary | ICD-10-CM

## 2018-09-08 MED ORDER — CEFTRIAXONE SODIUM 1 G IJ SOLR
1.0000 g | Freq: Once | INTRAMUSCULAR | Status: AC
  Administered 2018-09-08: 1 g via INTRAMUSCULAR

## 2018-09-08 MED ORDER — SULFAMETHOXAZOLE-TRIMETHOPRIM 800-160 MG PO TABS: 1.0000 | ORAL_TABLET | Freq: Two times a day (BID) | ORAL | 0 refills | Status: AC

## 2018-09-08 MED ORDER — MUPIROCIN 2 % EX OINT: TOPICAL_OINTMENT | CUTANEOUS | 0 refills | Status: DC

## 2018-09-08 NOTE — Discharge Instructions (Addendum)
Take medication as prescribed. Rest. Drink plenty of fluids. Monitor.  ° °Follow up with your primary care physician this week as needed. Return to Urgent care for new or worsening concerns.  ° °

## 2018-09-08 NOTE — ED Triage Notes (Signed)
Patient c/o red, warm, tender area on right elbow.  Patient states that she has bee taking an old antibiotic for 3 days.  Patient denies fevers.

## 2018-09-08 NOTE — ED Provider Notes (Signed)
[facility] ____________________________________________  Time seen: Approximately 11:44 AM  I have reviewed the triage vital signs and the nursing notes.   HISTORY  Chief Complaint Cellulitis   HPI Desiree Garrett is a 23 y.o. female presenting for evaluation of right posterior elbow redness.  Patient reports this is been worsening over the last 3 days.  States that she had one area that seemed like a mosquito bite.  Denies other known insect bite.  States she had some leftover Bactrim so she took that, but reports the redness has continued to increase in size.  Possible history of MRSA.  Has been keeping clean and applying warm compresses.  Reports tetanus immunization is up-to-date.  States the area is tender but still has full range of motion present.  No paresthesias.  Denies fevers, cough, congestion, chest pain, shortness of breath or other complaints.  Reports otherwise doing well.  Patient's last menstrual period was 08/11/2018 (approximate).   Past Medical History:  Diagnosis Date  . Eczema   . Interstitial cystitis     There are no active problems to display for this patient.   Past Surgical History:  Procedure Laterality Date  . WISDOM TOOTH EXTRACTION        Current Facility-Administered Medications:  .  cefTRIAXone (ROCEPHIN) injection 1 g, 1 g, Intramuscular, Once, Renford Dills, NP  Current Outpatient Medications:  .  norethindrone-ethinyl estradiol 1/35 (ALAYCEN 1/35) tablet, Take 1 tablet by mouth daily., Disp: 28 tablet, Rfl: 12 .  mupirocin ointment (BACTROBAN) 2 %, Apply two times a day for 10 days., Disp: 22 g, Rfl: 0 .  sulfamethoxazole-trimethoprim (BACTRIM DS) 800-160 MG tablet, Take 1 tablet by mouth 2 (two) times daily for 10 days., Disp: 20 tablet, Rfl: 0  Allergies Patient has no known allergies.  Family History  Problem Relation Age of Onset  . Diabetes Father   . Asthma Father   . Asthma Brother   . Cancer Maternal  Grandfather        Prostate  . Diabetes Paternal Grandmother   . Kidney disease Paternal Grandmother   . Diabetes Paternal Grandfather   . Heart disease Paternal Grandfather   . Thyroid disease Mother     Social History Social History   Tobacco Use  . Smoking status: Never Smoker  . Smokeless tobacco: Never Used  Substance Use Topics  . Alcohol use: Yes    Alcohol/week: 0.0 standard drinks    Comment: Rare  . Drug use: No    Review of Systems Constitutional: No fever Cardiovascular: Denies chest pain. Respiratory: Denies shortness of breath. Musculoskeletal: Negative for back pain. Skin: Positive rash.  ____________________________________________   PHYSICAL EXAM:  VITAL SIGNS: ED Triage Vitals  Enc Vitals Group     BP 09/08/18 1116 114/79     Pulse Rate 09/08/18 1116 87     Resp 09/08/18 1116 16     Temp 09/08/18 1116 98.6 F (37 C)     Temp Source 09/08/18 1116 Oral     SpO2 09/08/18 1116 99 %     Weight 09/08/18 1113 155 lb (70.3 kg)     Height 09/08/18 1113 5\' 10"  (1.778 m)     Head Circumference --      Peak Flow --      Pain Score 09/08/18 1113 3     Pain Loc --      Pain Edu? --      Excl. in GC? --     Constitutional: Alert  and oriented. Well appearing and in no acute distress. ENT      Head: Normocephalic and atraumatic. Cardiovascular: Normal rate, regular rhythm. Grossly normal heart sounds.  Good peripheral circulation. Respiratory: Normal respiratory effort without tachypnea nor retractions. Breath sounds are clear and equal bilaterally. No wheezes, rales, rhonchi. Musculoskeletal: Steady gait.  Right upper extremity with full range of motion present. Neurologic:  Normal speech and language. Speech is normal. No gait instability.  Skin:  Skin is warm, dry.  Except: Right posterior elbow with small excoriated area with surrounding erythema, minimal induration at excoriation site, no fluctuance, no drainage. Psychiatric: Mood and affect are  normal. Speech and behavior are normal. Patient exhibits appropriate insight and judgment   ___________________________________________   LABS (all labs ordered are listed, but only abnormal results are displayed)  Labs Reviewed - No data to display ____________________________________________   PROCEDURES Procedures    INITIAL IMPRESSION / ASSESSMENT AND PLAN / ED COURSE  Pertinent labs & imaging results that were available during my care of the patient were reviewed by me and considered in my medical decision making (see chart for details).  Well-appearing patient.  No acute distress.  Right posterior elbow cellulitis.  Will treat with oral Bactrim and topical Bactroban.  1 g IM Rocephin given in urgent care.  Counseled to continue to monitor and supportive care.Discussed indication, risks and benefits of medications with patient.  Discussed follow up with Primary care physician this week. Discussed follow up and return parameters including no resolution or any worsening concerns. Patient verbalized understanding and agreed to plan.   ____________________________________________   FINAL CLINICAL IMPRESSION(S) / ED DIAGNOSES  Final diagnoses:  Cellulitis of right elbow     ED Discharge Orders         Ordered    mupirocin ointment (BACTROBAN) 2 %     09/08/18 1148    sulfamethoxazole-trimethoprim (BACTRIM DS) 800-160 MG tablet  2 times daily     09/08/18 1148           Note: This dictation was prepared with Dragon dictation along with smaller phrase technology. Any transcriptional errors that result from this process are unintentional.         Renford DillsMiller, Kyria Bumgardner, NP 09/08/18 1148

## 2019-01-31 ENCOUNTER — Encounter: Payer: Self-pay | Admitting: Gynecology

## 2019-06-16 ENCOUNTER — Encounter: Payer: 59 | Admitting: Obstetrics and Gynecology

## 2019-06-22 ENCOUNTER — Ambulatory Visit (INDEPENDENT_AMBULATORY_CARE_PROVIDER_SITE_OTHER): Payer: 59 | Admitting: Women's Health

## 2019-06-22 ENCOUNTER — Other Ambulatory Visit: Payer: Self-pay

## 2019-06-22 ENCOUNTER — Encounter: Payer: Self-pay | Admitting: Women's Health

## 2019-06-22 VITALS — BP 122/78 | Ht 69.0 in | Wt 169.0 lb

## 2019-06-22 DIAGNOSIS — Z01419 Encounter for gynecological examination (general) (routine) without abnormal findings: Secondary | ICD-10-CM

## 2019-06-22 MED ORDER — ALYACEN 1/35 1-35 MG-MCG PO TABS: 1.0000 | ORAL_TABLET | Freq: Every day | ORAL | 4 refills | Status: DC

## 2019-06-22 NOTE — Addendum Note (Signed)
Addended by: Demetria Pore A on: 06/22/2019 12:12 PM   Modules accepted: Orders

## 2019-06-22 NOTE — Progress Notes (Signed)
Desiree Garrett Aug 02, 1995 [id number]    History:    Presents for annual exam.  Monthly cycle on Ortho-Novum without complaint.  Same partner.  Normal Pap history.  Has not had Gardasil declines.  Past medical history, past surgical history, family history and social history were all reviewed and documented in the EPIC chart.  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  Has a thorough bred horse Octavia.  Father diabetes.  ROS:  A ROS was performed and pertinent positives and negatives are included.  Exam:  Vitals:   06/22/19 1108  BP: 122/78  Weight: 169 lb (76.7 kg)  Height: 5\' 9"  (1.753 m)   Body mass index is 24.96 kg/m.   General appearance:  Normal Thyroid:  Symmetrical, normal in size, without palpable masses or nodularity. Respiratory  Auscultation:  Clear without wheezing or rhonchi Cardiovascular  Auscultation:  Regular rate, without rubs, murmurs or gallops  Edema/varicosities:  Not grossly evident Abdominal  Soft,nontender, without masses, guarding or rebound.  Liver/spleen:  No organomegaly noted  Hernia:  None appreciated  Skin  Inspection:  Grossly normal   Breasts: Examined lying and sitting.     Right: Without masses, retractions, discharge or axillary adenopathy.     Left: Without masses, retractions, discharge or axillary adenopathy. Gentitourinary   Inguinal/mons:  Normal without inguinal adenopathy  External genitalia:  Normal  BUS/Urethra/Skene's glands:  Normal  Vagina:  Normal  Cervix:  Normal  Uterus:  normal in size, shape and contour.  Midline and mobile  Adnexa/parametria:     Rt: Without masses or tenderness.   Lt: Without masses or tenderness.  Anus and perineum: Normal   Assessment/Plan:  24 y.o. S WF G2 0 for annual exam with no complaints of vaginal discharge, urinary symptoms, GI problems or abdominal pain.  Regular monthly cycle on Ortho-Novum  Plan: Ortho-Novum prescription, proper use, slight risk for blood clots and strokes  reviewed.  Denies need for STD screen.  SBEs, exercise, calcium rich foods, MVI daily encouraged.  Continue healthy lifestyle of regular exercise and horseback riding/always wears helmet.  CBC, CMP, Pap.  Pap normal 04/2016, new screening guidelines reviewed.05/2016 [facility], 11:43 AM 06/22/2019

## 2019-06-22 NOTE — Patient Instructions (Signed)
Good to meet you! MVI daily Health Maintenance, Female Adopting a healthy lifestyle and getting preventive care are important in promoting health and wellness. Ask your health care provider about:  The right schedule for you to have regular tests and exams.  Things you can do on your own to prevent diseases and keep yourself healthy. What should I know about diet, weight, and exercise? Eat a healthy diet   Eat a diet that includes plenty of vegetables, fruits, low-fat dairy products, and lean protein.  Do not eat a lot of foods that are high in solid fats, added sugars, or sodium. Maintain a healthy weight Body mass index (BMI) is used to identify weight problems. It estimates body fat based on height and weight. Your health care provider can help determine your BMI and help you achieve or maintain a healthy weight. Get regular exercise Get regular exercise. This is one of the most important things you can do for your health. Most adults should:  Exercise for at least 150 minutes each week. The exercise should increase your heart rate and make you sweat (moderate-intensity exercise).  Do strengthening exercises at least twice a week. This is in addition to the moderate-intensity exercise.  Spend less time sitting. Even light physical activity can be beneficial. Watch cholesterol and blood lipids Have your blood tested for lipids and cholesterol at 24 years of age, then have this test every 5 years. Have your cholesterol levels checked more often if:  Your lipid or cholesterol levels are high.  You are older than 24 years of age.  You are at high risk for heart disease. What should I know about cancer screening? Depending on your health history and family history, you may need to have cancer screening at various ages. This may include screening for:  Breast cancer.  Cervical cancer.  Colorectal cancer.  Skin cancer.  Lung cancer. What should I know about heart disease,  diabetes, and high blood pressure? Blood pressure and heart disease  High blood pressure causes heart disease and increases the risk of stroke. This is more likely to develop in people who have high blood pressure readings, are of African descent, or are overweight.  Have your blood pressure checked: ? Every 3-5 years if you are 24-13 years of age. ? Every year if you are 11 years old or older. Diabetes Have regular diabetes screenings. This checks your fasting blood sugar level. Have the screening done:  Once every three years after age 55 if you are at a normal weight and have a low risk for diabetes.  More often and at a younger age if you are overweight or have a high risk for diabetes. What should I know about preventing infection? Hepatitis B If you have a higher risk for hepatitis B, you should be screened for this virus. Talk with your health care provider to find out if you are at risk for hepatitis B infection. Hepatitis C Testing is recommended for:  Everyone born from [birth year] through 1965.  Anyone with known risk factors for hepatitis C. Sexually transmitted infections (STIs)  Get screened for STIs, including gonorrhea and chlamydia, if: ? You are sexually active and are younger than 24 years of age. ? You are older than 24 years of age and your health care provider tells you that you are at risk for this type of infection. ? Your sexual activity has changed since you were last screened, and you are at increased risk for chlamydia or gonorrhea.  Ask your health care provider if you are at risk.  Ask your health care provider about whether you are at high risk for HIV. Your health care provider may recommend a prescription medicine to help prevent HIV infection. If you choose to take medicine to prevent HIV, you should first get tested for HIV. You should then be tested every 3 months for as long as you are taking the medicine. Pregnancy  If you are about to stop having your  period (premenopausal) and you may become pregnant, seek counseling before you get pregnant.  Take 400 to 800 micrograms (mcg) of folic acid every day if you become pregnant.  Ask for birth control (contraception) if you want to prevent pregnancy. Osteoporosis and menopause Osteoporosis is a disease in which the bones lose minerals and strength with aging. This can result in bone fractures. If you are 22 years old or older, or if you are at risk for osteoporosis and fractures, ask your health care provider if you should:  Be screened for bone loss.  Take a calcium or vitamin D supplement to lower your risk of fractures.  Be given hormone replacement therapy (HRT) to treat symptoms of menopause. Follow these instructions at home: Lifestyle  Do not use any products that contain nicotine or tobacco, such as cigarettes, e-cigarettes, and chewing tobacco. If you need help quitting, ask your health care provider.  Do not use street drugs.  Do not share needles.  Ask your health care provider for help if you need support or information about quitting drugs. Alcohol use  Do not drink alcohol if: ? Your health care provider tells you not to drink. ? You are pregnant, may be pregnant, or are planning to become pregnant.  If you drink alcohol: ? Limit how much you use to 0-1 drink a day. ? Limit intake if you are breastfeeding.  Be aware of how much alcohol is in your drink. In the [place] one drink equals one 12 oz bottle of beer (355 mL), one 5 oz glass of wine (148 mL), or one 1 oz glass of hard liquor (44 mL). General instructions  Schedule regular health, dental, and eye exams.  Stay current with your vaccines.  Tell your health care provider if: ? You often feel depressed. ? You have ever been abused or do not feel safe at home. Summary  Adopting a healthy lifestyle and getting preventive care are important in promoting health and wellness.  Follow your health care provider's  instructions about healthy diet, exercising, and getting tested or screened for diseases.  Follow your health care provider's instructions on monitoring your cholesterol and blood pressure. This information is not intended to replace advice given to you by your health care provider. Make sure you discuss any questions you have with your health care provider. Document Revised: 04/21/2018 Document Reviewed: 04/21/2018 Elsevier Patient Education  2020 [employer].

## 2019-06-23 LAB — PAP IG W/ RFLX HPV ASCU

## 2019-06-23 LAB — CBC WITH DIFFERENTIAL/PLATELET
Absolute Monocytes: 543 cells/uL (ref 200–950)
Basophils Absolute: 57 cells/uL (ref 0–200)
Basophils Relative: 0.7 %
Eosinophils Absolute: 462 cells/uL (ref 15–500)
Eosinophils Relative: 5.7 %
HCT: 42.2 % (ref 35.0–45.0)
Hemoglobin: 14.1 g/dL (ref 11.7–15.5)
Lymphs Abs: 2681 cells/uL (ref 850–3900)
MCH: 29.6 pg (ref 27.0–33.0)
MCHC: 33.4 g/dL (ref 32.0–36.0)
MCV: 88.5 fL (ref 80.0–100.0)
MPV: 10.3 fL (ref 7.5–12.5)
Monocytes Relative: 6.7 %
Neutro Abs: 4358 cells/uL (ref 1500–7800)
Neutrophils Relative %: 53.8 %
Platelets: 322 10*3/uL (ref 140–400)
RBC: 4.77 10*6/uL (ref 3.80–5.10)
RDW: 12.4 % (ref 11.0–15.0)
Total Lymphocyte: 33.1 %
WBC: 8.1 10*3/uL (ref 3.8–10.8)

## 2019-06-23 LAB — COMPREHENSIVE METABOLIC PANEL
AG Ratio: 1.7 (calc) (ref 1.0–2.5)
ALT: 22 U/L (ref 6–29)
AST: 18 U/L (ref 10–30)
Albumin: 4.3 g/dL (ref 3.6–5.1)
Alkaline phosphatase (APISO): 45 U/L (ref 31–125)
BUN: 11 mg/dL (ref 7–25)
CO2: 29 mmol/L (ref 20–32)
Calcium: 9.4 mg/dL (ref 8.6–10.2)
Chloride: 106 mmol/L (ref 98–110)
Creat: 0.71 mg/dL (ref 0.50–1.10)
Globulin: 2.5 g/dL (calc) (ref 1.9–3.7)
Glucose, Bld: 84 mg/dL (ref 65–99)
Potassium: 4 mmol/L (ref 3.5–5.3)
Sodium: 141 mmol/L (ref 135–146)
Total Bilirubin: 0.3 mg/dL (ref 0.2–1.2)
Total Protein: 6.8 g/dL (ref 6.1–8.1)

## 2019-06-23 LAB — URINALYSIS, COMPLETE W/RFL CULTURE
Bacteria, UA: NONE SEEN /HPF
Bilirubin Urine: NEGATIVE
Glucose, UA: NEGATIVE
Hgb urine dipstick: NEGATIVE
Hyaline Cast: NONE SEEN /LPF
Ketones, ur: NEGATIVE
Leukocyte Esterase: NEGATIVE
Nitrites, Initial: NEGATIVE
Protein, ur: NEGATIVE
RBC / HPF: NONE SEEN /HPF (ref 0–2)
Specific Gravity, Urine: 1.021 (ref 1.001–1.03)
WBC, UA: NONE SEEN /HPF (ref 0–5)
pH: 6.5 (ref 5.0–8.0)

## 2019-06-23 LAB — NO CULTURE INDICATED

## 2019-08-01 IMAGING — CR DG FINGER THUMB 2+V*L*
3 series · 3 of 3 positions shown · non-contrast
Comparison: None.

CLINICAL DATA: Cat bite to palmar aspect of left thumb

EXAM:
LEFT THUMB 2+V

[finger ap]
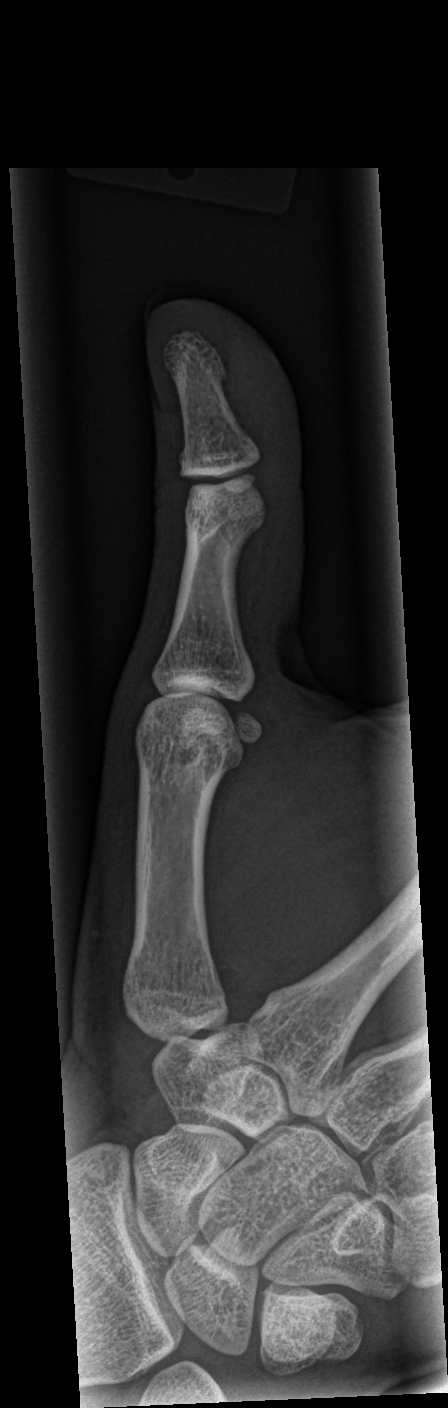

[finger obl]
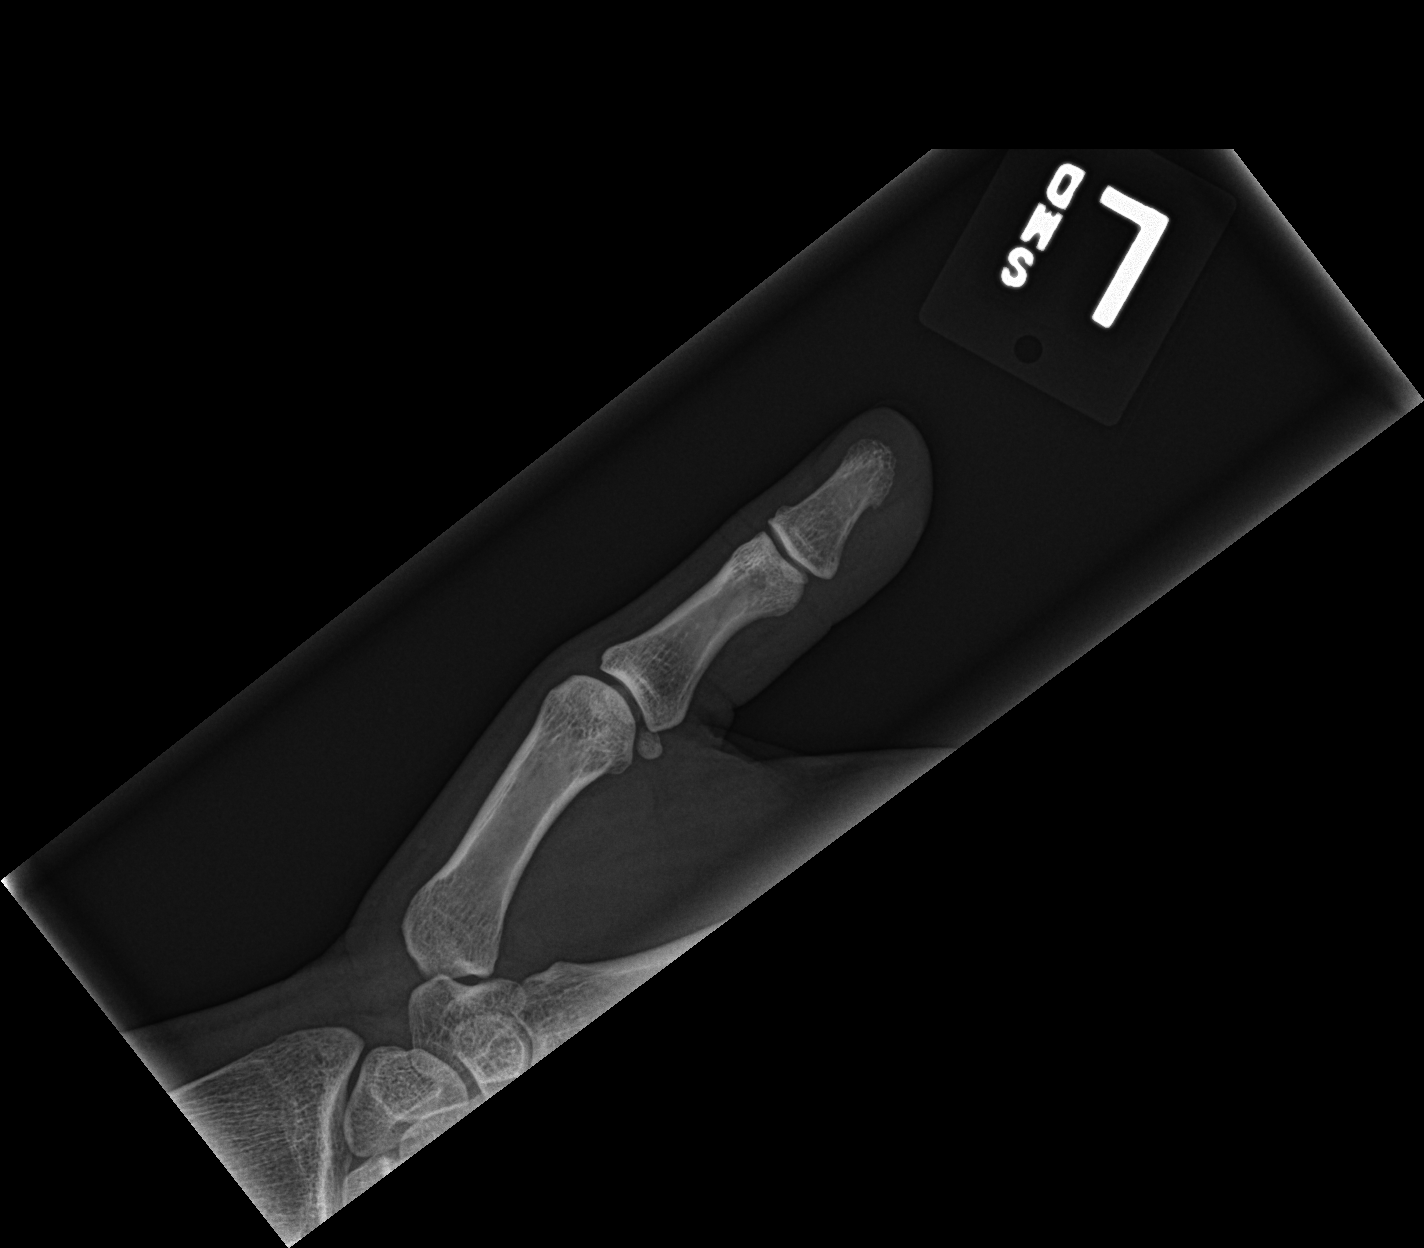

[finger lat]
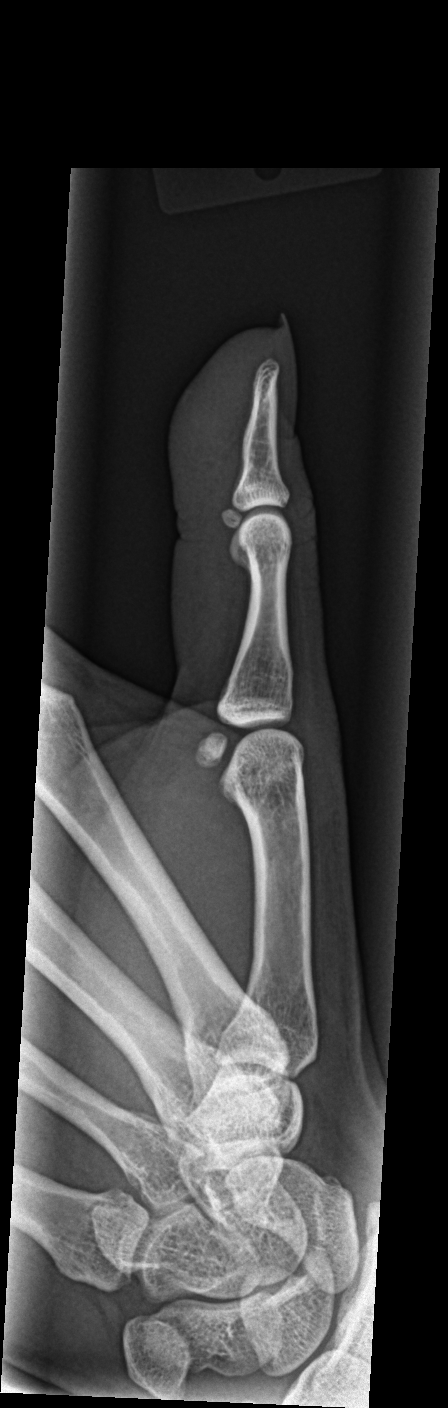

[3 of 3 positions shown; findings below may reference images not displayed]

FINDINGS: There is no evidence of fracture or dislocation. There is no
evidence of arthropathy or other focal bone abnormality. Mild soft
tissue swelling at the palmar aspect of the left thumb.
IMPRESSION: No evidence of osseous abnormality.

## 2020-06-25 ENCOUNTER — Encounter: Payer: 59 | Admitting: Nurse Practitioner

## 2020-06-26 ENCOUNTER — Other Ambulatory Visit: Payer: Self-pay

## 2020-06-26 ENCOUNTER — Ambulatory Visit (INDEPENDENT_AMBULATORY_CARE_PROVIDER_SITE_OTHER): Payer: 59 | Admitting: Nurse Practitioner

## 2020-06-26 ENCOUNTER — Encounter: Payer: Self-pay | Admitting: Nurse Practitioner

## 2020-06-26 VITALS — BP 124/80 | Ht 70.0 in | Wt 159.0 lb

## 2020-06-26 DIAGNOSIS — Z3041 Encounter for surveillance of contraceptive pills: Secondary | ICD-10-CM | POA: Diagnosis not present

## 2020-06-26 DIAGNOSIS — Z23 Encounter for immunization: Secondary | ICD-10-CM | POA: Diagnosis not present

## 2020-06-26 DIAGNOSIS — Z01419 Encounter for gynecological examination (general) (routine) without abnormal findings: Secondary | ICD-10-CM | POA: Diagnosis not present

## 2020-06-26 MED ORDER — ALYACEN 1/35 1-35 MG-MCG PO TABS: 1.0000 | ORAL_TABLET | Freq: Every day | ORAL | 3 refills | Status: DC

## 2020-06-26 NOTE — Progress Notes (Signed)
   Desiree Garrett Dec 22, 1995 [id number]   History:  25 y.o. G0 presents for annual exam without GYN complaints. Irregular cycles on OCPs. Has always had irregular cycles with negative workup. Had 1st dose of Gardasil 04/18/2020 at [facility] and would like second dose today. Normal pap history. Sexually active with first partner.   Gynecologic History Patient's last menstrual period was 06/19/2020. Period Pattern: (!) Irregular Menstrual Flow: Moderate Menstrual Control: Maxi pad,Tampon Dysmenorrhea: (!) Mild Dysmenorrhea Symptoms: Cramping Contraception/Family planning: OCP (estrogen/progesterone)  Health Maintenance Last Pap: 06/22/2019. Results were: normal Last mammogram: N/A  Last colonoscopy: N/A Last Dexa: N/A  Past medical history, past surgical history, family history and social history were all reviewed and documented in the EPIC chart.  ROS:  A ROS was performed and pertinent positives and negatives are included.  Exam:  Vitals:   06/26/20 1035  BP: 124/80  Weight: 159 lb (72.1 kg)  Height: 5\' 10"  (1.778 m)   Body mass index is 22.81 kg/m.  General appearance:  Normal Thyroid:  Symmetrical, normal in size, without palpable masses or nodularity. Respiratory  Auscultation:  Clear without wheezing or rhonchi Cardiovascular  Auscultation:  Regular rate, without rubs, murmurs or gallops  Edema/varicosities:  Not grossly evident Abdominal  Soft,nontender, without masses, guarding or rebound.  Liver/spleen:  No organomegaly noted  Hernia:  None appreciated  Skin  Inspection:  Grossly normal   Breasts: Examined lying and sitting.   Right: Without masses, retractions, discharge or axillary adenopathy.   Left: Without masses, retractions, discharge or axillary adenopathy. Gentitourinary   Inguinal/mons:  Normal without inguinal adenopathy  External genitalia:  Normal  BUS/Urethra/Skene's glands:  Normal  Vagina:  Normal  Cervix:  Normal  Uterus:  Normal in size,  shape and contour.  Midline and mobile  Adnexa/parametria:     Rt: Without masses or tenderness.   Lt: Without masses or tenderness.  Anus and perineum: Normal  Assessment/Plan:  25 y.o. G0 for annual exam.   Well female exam with routine gynecological exam - Education provided on SBEs, importance of preventative screenings, current guidelines, high calcium diet, regular exercise, and multivitamin daily.   Encounter for surveillance of contraceptive pills - Plan: norethindrone-ethinyl estradiol 1/35 (ALAYCEN 1/35) tablet daily.  Taking as prescribed.  Refill x1 year provided.  Need for HPV vaccination - Plan: HPV 9-valent vaccine,Recombinat  Screening for cervical cancer -normal Pap history.  Will repeat at 3-year interval per guidelines.  Follow-up in 1 year for annual.   2/35 [facility], 10:44 AM 06/26/2020

## 2020-06-26 NOTE — Patient Instructions (Signed)
Health Maintenance, Female Adopting a healthy lifestyle and getting preventive care are important in promoting health and wellness. Ask your health care provider about:  The right schedule for you to have regular tests and exams.  Things you can do on your own to prevent diseases and keep yourself healthy. What should I know about diet, weight, and exercise? Eat a healthy diet  Eat a diet that includes plenty of vegetables, fruits, low-fat dairy products, and lean protein.  Do not eat a lot of foods that are high in solid fats, added sugars, or sodium.   Maintain a healthy weight Body mass index (BMI) is used to identify weight problems. It estimates body fat based on height and weight. Your health care provider can help determine your BMI and help you achieve or maintain a healthy weight. Get regular exercise Get regular exercise. This is one of the most important things you can do for your health. Most adults should:  Exercise for at least 150 minutes each week. The exercise should increase your heart rate and make you sweat (moderate-intensity exercise).  Do strengthening exercises at least twice a week. This is in addition to the moderate-intensity exercise.  Spend less time sitting. Even light physical activity can be beneficial. Watch cholesterol and blood lipids Have your blood tested for lipids and cholesterol at 25 years of age, then have this test every 5 years. Have your cholesterol levels checked more often if:  Your lipid or cholesterol levels are high.  You are older than 25 years of age.  You are at high risk for heart disease. What should I know about cancer screening? Depending on your health history and family history, you may need to have cancer screening at various ages. This may include screening for:  Breast cancer.  Cervical cancer.  Colorectal cancer.  Skin cancer.  Lung cancer. What should I know about heart disease, diabetes, and high blood  pressure? Blood pressure and heart disease  High blood pressure causes heart disease and increases the risk of stroke. This is more likely to develop in people who have high blood pressure readings, are of African descent, or are overweight.  Have your blood pressure checked: ? Every 3-5 years if you are 18-39 years of age. ? Every year if you are 40 years old or older. Diabetes Have regular diabetes screenings. This checks your fasting blood sugar level. Have the screening done:  Once every three years after age 40 if you are at a normal weight and have a low risk for diabetes.  More often and at a younger age if you are overweight or have a high risk for diabetes. What should I know about preventing infection? Hepatitis B If you have a higher risk for hepatitis B, you should be screened for this virus. Talk with your health care provider to find out if you are at risk for hepatitis B infection. Hepatitis C Testing is recommended for:  Everyone born from 1945 through 1965.  Anyone with known risk factors for hepatitis C. Sexually transmitted infections (STIs)  Get screened for STIs, including gonorrhea and chlamydia, if: ? You are sexually active and are younger than 24 years of age. ? You are older than 24 years of age and your health care provider tells you that you are at risk for this type of infection. ? Your sexual activity has changed since you were last screened, and you are at increased risk for chlamydia or gonorrhea. Ask your health care provider   if you are at risk.  Ask your health care provider about whether you are at high risk for HIV. Your health care provider may recommend a prescription medicine to help prevent HIV infection. If you choose to take medicine to prevent HIV, you should first get tested for HIV. You should then be tested every 3 months for as long as you are taking the medicine. Pregnancy  If you are about to stop having your period (premenopausal) and  you may become pregnant, seek counseling before you get pregnant.  Take 400 to 800 micrograms (mcg) of folic acid every day if you become pregnant.  Ask for birth control (contraception) if you want to prevent pregnancy. Osteoporosis and menopause Osteoporosis is a disease in which the bones lose minerals and strength with aging. This can result in bone fractures. If you are 65 years old or older, or if you are at risk for osteoporosis and fractures, ask your health care provider if you should:  Be screened for bone loss.  Take a calcium or vitamin D supplement to lower your risk of fractures.  Be given hormone replacement therapy (HRT) to treat symptoms of menopause. Follow these instructions at home: Lifestyle  Do not use any products that contain nicotine or tobacco, such as cigarettes, e-cigarettes, and chewing tobacco. If you need help quitting, ask your health care provider.  Do not use street drugs.  Do not share needles.  Ask your health care provider for help if you need support or information about quitting drugs. Alcohol use  Do not drink alcohol if: ? Your health care provider tells you not to drink. ? You are pregnant, may be pregnant, or are planning to become pregnant.  If you drink alcohol: ? Limit how much you use to 0-1 drink a day. ? Limit intake if you are breastfeeding.  Be aware of how much alcohol is in your drink. In the [place] one drink equals one 12 oz bottle of beer (355 mL), one 5 oz glass of wine (148 mL), or one 1 oz glass of hard liquor (44 mL). General instructions  Schedule regular health, dental, and eye exams.  Stay current with your vaccines.  Tell your health care provider if: ? You often feel depressed. ? You have ever been abused or do not feel safe at home. Summary  Adopting a healthy lifestyle and getting preventive care are important in promoting health and wellness.  Follow your health care provider's instructions about healthy  diet, exercising, and getting tested or screened for diseases.  Follow your health care provider's instructions on monitoring your cholesterol and blood pressure. This information is not intended to replace advice given to you by your health care provider. Make sure you discuss any questions you have with your health care provider. Document Revised: 04/21/2018 Document Reviewed: 04/21/2018 Elsevier Patient Education  2021 [employer]  

## 2020-09-24 ENCOUNTER — Ambulatory Visit
Admission: EM | Admit: 2020-09-24 | Discharge: 2020-09-24 | Disposition: A | Discharge status: Home / Self Care | Payer: 59 | Attending: Family Medicine | Admitting: Family Medicine

## 2020-09-24 ENCOUNTER — Encounter: Payer: Self-pay | Admitting: Emergency Medicine

## 2020-09-24 DIAGNOSIS — Z793 Long term (current) use of hormonal contraceptives: Secondary | ICD-10-CM | POA: Insufficient documentation

## 2020-09-24 DIAGNOSIS — Z20822 Contact with and (suspected) exposure to covid-19: Secondary | ICD-10-CM | POA: Insufficient documentation

## 2020-09-24 DIAGNOSIS — J09X2 Influenza due to identified novel influenza A virus with other respiratory manifestations: Secondary | ICD-10-CM | POA: Diagnosis not present

## 2020-09-24 DIAGNOSIS — R051 Acute cough: Secondary | ICD-10-CM | POA: Diagnosis present

## 2020-09-24 LAB — RESP PANEL BY RT-PCR (FLU A&B, COVID) ARPGX2
Influenza A by PCR: POSITIVE — AB
Influenza B by PCR: NEGATIVE
SARS Coronavirus 2 by RT PCR: NEGATIVE

## 2020-09-24 NOTE — ED Triage Notes (Signed)
Patient c/o fever, cough and headache that started 3-4 days ago. Work is requiring her to be tested for COVID and flu.

## 2020-09-24 NOTE — Discharge Instructions (Signed)
You have tested positive for influenza A today but you are outside the treatment window for any antiviral therapy.  Since you have not had a fever for greater than 24 hours without taking medication you are cleared to return to work and school.

## 2020-09-24 NOTE — ED Provider Notes (Signed)
[facility]    CSN: [id number] Arrival date & time: 09/24/20  1342      History   Chief Complaint Chief Complaint  Patient presents with  . Cough  . Fever    HPI MORAIMA BURD is a 25 y.o. female.   HPI   25 year old female here for evaluation of flulike symptoms.  Patient reports that for last 3 to 4 days she has been experiencing fever with a T-max of 103, headache, runny nose and nasal congestion, body aches, productive cough for yellow sputum, and nausea.  She denies ear pain or pressure, sore throat, shortness of breath or wheezing, vomiting, or diarrhea.  Patient was sent here by her employer to be tested before she can return to work.  Past Medical History:  Diagnosis Date  . Eczema   . Interstitial cystitis     There are no problems to display for this patient.   Past Surgical History:  Procedure Laterality Date  . WISDOM TOOTH EXTRACTION      OB History    Gravida  0   Para  0   Term  0   Preterm  0   AB  0   Living  0     SAB  0   IAB  0   Ectopic  0   Multiple  0   Live Births  0            Home Medications    Prior to Admission medications   Medication Sig Start Date End Date Taking? Authorizing Provider  norethindrone-ethinyl estradiol 1/35 (ALAYCEN 1/35) tablet Take 1 tablet by mouth daily. 06/26/20   [name], NP    Family History Family History  Problem Relation Age of Onset  . Diabetes Father   . Asthma Father   . Asthma Brother   . Cancer Maternal Grandfather        Prostate  . Diabetes Paternal Grandmother   . Kidney disease Paternal Grandmother   . Diabetes Paternal Grandfather   . Heart disease Paternal Grandfather   . Thyroid disease Mother     Social History Social History   Tobacco Use  . Smoking status: Never Smoker  . Smokeless tobacco: Never Used  Vaping Use  . Vaping Use: Never used  Substance Use Topics  . Alcohol use: Yes    Alcohol/week: 0.0 standard drinks     Comment: Rare  . Drug use: No     Allergies   Patient has no known allergies.   Review of Systems Review of Systems  Constitutional: Positive for fever. Negative for activity change and appetite change.  HENT: Positive for congestion and rhinorrhea. Negative for ear pain.   Respiratory: Positive for cough. Negative for shortness of breath.   Gastrointestinal: Positive for nausea. Negative for diarrhea and vomiting.  Musculoskeletal: Positive for arthralgias and myalgias.  Skin: Negative for rash.  Neurological: Positive for headaches.  Hematological: Negative.   Psychiatric/Behavioral: Negative.      Physical Exam Triage Vital Signs ED Triage Vitals  Enc Vitals Group     BP 09/24/20 1430 124/81     Pulse Rate 09/24/20 1430 89     Resp 09/24/20 1430 18     Temp 09/24/20 1430 98.4 F (36.9 C)     Temp Source 09/24/20 1430 Oral     SpO2 09/24/20 1430 100 %     Weight 09/24/20 1428 160 lb (72.6 kg)     Height 09/24/20 1428  5\' 10"  (1.778 m)     Head Circumference --      Peak Flow --      Pain Score 09/24/20 1428 0     Pain Loc --      Pain Edu? --      Excl. in GC? --    No data found.  Updated Vital Signs BP 124/81 (BP Location: Right Arm)   Pulse 89   Temp 98.4 F (36.9 C) (Oral)   Resp 18   Ht 5\' 10"  (1.778 m)   Wt 160 lb (72.6 kg)   SpO2 100%   BMI 22.96 kg/m   Visual Acuity Right Eye Distance:   Left Eye Distance:   Bilateral Distance:    Right Eye Near:   Left Eye Near:    Bilateral Near:     Physical Exam Vitals and nursing note reviewed.  Constitutional:      General: She is not in acute distress.    Appearance: Normal appearance. She is not ill-appearing.  HENT:     Head: Normocephalic and atraumatic.     Right Ear: Tympanic membrane, ear canal and external ear normal.     Left Ear: Tympanic membrane, ear canal and external ear normal.     Nose: Congestion present.     Mouth/Throat:     Mouth: Mucous membranes are moist.     Pharynx:  Oropharynx is clear. No posterior oropharyngeal erythema.  Cardiovascular:     Rate and Rhythm: Normal rate and regular rhythm.     Pulses: Normal pulses.     Heart sounds: Normal heart sounds. No murmur heard. No gallop.   Pulmonary:     Effort: Pulmonary effort is normal.     Breath sounds: Normal breath sounds. No wheezing, rhonchi or rales.  Musculoskeletal:     Cervical back: Normal range of motion and neck supple.  Lymphadenopathy:     Cervical: No cervical adenopathy.  Skin:    General: Skin is warm and dry.     Capillary Refill: Capillary refill takes less than 2 seconds.     Findings: No erythema or rash.  Neurological:     General: No focal deficit present.     Mental Status: She is alert and oriented to person, place, and time.  Psychiatric:        Mood and Affect: Mood normal.        Behavior: Behavior normal.        Thought Content: Thought content normal.        Judgment: Judgment normal.      UC Treatments / Results  Labs (all labs ordered are listed, but only abnormal results are displayed) Labs Reviewed  RESP PANEL BY RT-PCR (FLU A&B, COVID) ARPGX2 - Abnormal; Notable for the following components:      Result Value   Influenza A by PCR POSITIVE (*)    All other components within normal limits    EKG   Radiology No results found.  Procedures Procedures (including critical care time)  Medications Ordered in UC Medications - No data to display  Initial Impression / Assessment and Plan / UC Course  I have reviewed the triage vital signs and the nursing notes.  Pertinent labs & imaging results that were available during my care of the patient were reviewed by me and considered in my medical decision making (see chart for details).   Patient is a very pleasant 25 year old female here for evaluation of flulike symptoms that started  3 to 4 days ago.  She reports her T-max was 103 at the outset of her illness but has since resolved.  She reports that  she has had a headache and body aches, mild nausea, productive cough for yellow sputum but denies shortness of breath or wheezing, runny nose, and nasal congestion.  Physical exam reveals bilateral pearly gray tympanic membranes with normal light reflex and clear external auditory canals.  Nasal mucosa is mildly erythematous and edematous with scant clear nasal discharge.  Oropharyngeal exam is unremarkable.  No cervical lymphadenopathy on exam.  Cardiopulmonary exam benign.  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  She has been out of work for last 3 to 4 days since her symptoms started.  Respiratory triplex panel collected at triage.  Patient's respiratory panel is positive for influenza A.  Patient reports that she has not had a fever without medication for greater than 24 hours so I am clearing her to return to work.  Patient declines medication for cough.  Final Clinical Impressions(s) / UC Diagnoses   Final diagnoses:  Influenza due to identified novel influenza A virus with other respiratory manifestations     Discharge Instructions     You have tested positive for influenza A today but you are outside the treatment window for any antiviral therapy.  Since you have not had a fever for greater than 24 hours without taking medication you are cleared to return to work and school.    ED Prescriptions    None     PDMP not reviewed this encounter.   Becky Augusta, NP 09/24/20 1525

## 2020-10-23 ENCOUNTER — Other Ambulatory Visit: Payer: Self-pay

## 2020-10-23 ENCOUNTER — Ambulatory Visit
Admission: EM | Admit: 2020-10-23 | Discharge: 2020-10-23 | Disposition: A | Discharge status: Home / Self Care | Payer: 59 | Attending: Sports Medicine | Admitting: Sports Medicine

## 2020-10-23 DIAGNOSIS — N3001 Acute cystitis with hematuria: Secondary | ICD-10-CM

## 2020-10-23 DIAGNOSIS — R300 Dysuria: Secondary | ICD-10-CM

## 2020-10-23 DIAGNOSIS — R350 Frequency of micturition: Secondary | ICD-10-CM | POA: Diagnosis not present

## 2020-10-23 LAB — URINALYSIS, COMPLETE (UACMP) WITH MICROSCOPIC
Bilirubin Urine: NEGATIVE
Glucose, UA: NEGATIVE mg/dL
Ketones, ur: NEGATIVE mg/dL
Nitrite: POSITIVE — AB
Protein, ur: NEGATIVE mg/dL
Specific Gravity, Urine: 1.02 (ref 1.005–1.030)
WBC, UA: >50 WBC/hpf (ref 0–5)
pH: 6.5 (ref 5.0–8.0)

## 2020-10-23 MED ORDER — NITROFURANTOIN MONOHYD MACRO 100 MG PO CAPS: 100.0000 mg | ORAL_CAPSULE | Freq: Two times a day (BID) | ORAL | 0 refills | Status: DC

## 2020-10-23 NOTE — Discharge Instructions (Addendum)
As we discussed, your urinalysis is positive for a urinary tract infection.  I will send it for culture.  In the interim, I will treat you with an antibiotic.  Someone may contact you and ask you to stop the antibiotic and switch to another one depending on what the culture and sensitivity shows. Plenty of rest, plenty of fluids. I provided you a work note saying you were seen today but you can go back to work today. Please flush your system with plenty of water. You can continue to use the Azo. Please contact your urologist if your symptoms persist. If you develop any fevers or flank pain then please go to the emergency room.

## 2020-10-23 NOTE — ED Triage Notes (Signed)
Pt c/o urinary urgency and burning for about 3 days. Pt denies f/n/v/d, abd pain or other symptoms. Pt does have hx of interstitial cystitis.

## 2020-10-23 NOTE — ED Provider Notes (Signed)
[facility]    CSN: [id number] Arrival date & time: 10/23/20  1884      History   Chief Complaint Chief Complaint  Patient presents with   Dysuria    HPI Desiree Garrett is a 25 y.o. female.   Patient is a pleasant 25 year old female who presents for evaluation of the above issue.  She does not identify having a primary care physician.  She does report that she does have a diagnosis of interstitial cystitis but has not seen her urologist in 3 to 4 years.  She has had 3 days of dysuria, increased frequency and burning, increased urgency, and incomplete voiding.  She has been trying to flush her system with plenty of water.  She denies any flank pain.  No fever shakes chills.  She denies pregnancy and uses protection.  She does not want a pregnancy test.  She denies any vaginal bleeding or discharge.  She has been using Azo.  Some mild suprapubic tenderness is noted.  No chest pain shortness of breath.  No red flag signs or symptoms elicited on history.   Past Medical History:  Diagnosis Date   Eczema    Interstitial cystitis     There are no problems to display for this patient.   Past Surgical History:  Procedure Laterality Date   WISDOM TOOTH EXTRACTION      OB History     Gravida  0   Para  0   Term  0   Preterm  0   AB  0   Living  0      SAB  0   IAB  0   Ectopic  0   Multiple  0   Live Births  0            Home Medications    Prior to Admission medications   Medication Sig Start Date End Date Taking? Authorizing Provider  nitrofurantoin, macrocrystal-monohydrate, (MACROBID) 100 MG capsule Take 1 capsule (100 mg total) by mouth 2 (two) times daily. 10/23/20  Yes Delton See, MD  norethindrone-ethinyl estradiol 1/35 (ALAYCEN 1/35) tablet Take 1 tablet by mouth daily. 06/26/20  Yes [name], NP    Family History Family History  Problem Relation Age of Onset   Diabetes Father    Asthma Father    Asthma Brother     Cancer Maternal Grandfather        Prostate   Diabetes Paternal Grandmother    Kidney disease Paternal Grandmother    Diabetes Paternal Grandfather    Heart disease Paternal Grandfather    Thyroid disease Mother     Social History Social History   Tobacco Use   Smoking status: Never   Smokeless tobacco: Never  Vaping Use   Vaping Use: Never used  Substance Use Topics   Alcohol use: Yes    Alcohol/week: 0.0 standard drinks    Comment: Rare   Drug use: No     Allergies   Patient has no known allergies.   Review of Systems Review of Systems  Constitutional:  Negative for activity change, appetite change, chills, diaphoresis, fatigue and fever.  HENT:  Negative for congestion, ear pain, postnasal drip, rhinorrhea, sinus pressure, sinus pain, sneezing and sore throat.   Eyes:  Negative for pain.  Respiratory:  Negative for cough, chest tightness and shortness of breath.   Cardiovascular:  Negative for chest pain and palpitations.  Gastrointestinal:  Positive for abdominal pain. Negative for diarrhea, nausea  and vomiting.  Genitourinary:  Positive for dysuria, frequency and urgency. Negative for flank pain, hematuria, menstrual problem, pelvic pain, vaginal bleeding, vaginal discharge and vaginal pain.  Musculoskeletal:  Negative for back pain, myalgias and neck pain.  Skin:  Negative for color change, pallor, rash and wound.  Neurological:  Negative for dizziness, light-headedness and headaches.  All other systems reviewed and are negative.   Physical Exam Triage Vital Signs ED Triage Vitals  Enc Vitals Group     BP 10/23/20 0835 111/73     Pulse Rate 10/23/20 0835 71     Resp 10/23/20 0835 18     Temp 10/23/20 0835 98.1 F (36.7 C)     Temp Source 10/23/20 0835 Oral     SpO2 10/23/20 0835 99 %     Weight 10/23/20 0832 160 lb (72.6 kg)     Height 10/23/20 0832 5\' 10"  (1.778 m)     Head Circumference --      Peak Flow --      Pain Score 10/23/20 0832 4      Pain Loc --      Pain Edu? --      Excl. in GC? --    No data found.  Updated Vital Signs BP 111/73 (BP Location: Left Arm)   Pulse 71   Temp 98.1 F (36.7 C) (Oral)   Resp 18   Ht 5\' 10"  (1.778 m)   Wt 72.6 kg   SpO2 99%   BMI 22.96 kg/m   Visual Acuity Right Eye Distance:   Left Eye Distance:   Bilateral Distance:    Right Eye Near:   Left Eye Near:    Bilateral Near:     Physical Exam Vitals and nursing note reviewed.  Constitutional:      General: She is not in acute distress.    Appearance: Normal appearance. She is not ill-appearing, toxic-appearing or diaphoretic.  HENT:     Head: Normocephalic and atraumatic.     Nose: Nose normal. No congestion.     Mouth/Throat:     Mouth: Mucous membranes are moist.  Eyes:     General: No scleral icterus.    Conjunctiva/sclera: Conjunctivae normal.     Pupils: Pupils are equal, round, and reactive to light.  Cardiovascular:     Rate and Rhythm: Normal rate and regular rhythm.     Pulses: Normal pulses.     Heart sounds: Normal heart sounds. No murmur heard.   No friction rub. No gallop.  Pulmonary:     Effort: Pulmonary effort is normal.     Breath sounds: Normal breath sounds. No stridor. No wheezing, rhonchi or rales.  Abdominal:     General: Bowel sounds are normal.     Palpations: Abdomen is soft.     Tenderness: There is abdominal tenderness in the suprapubic area. There is no right CVA tenderness, left CVA tenderness, guarding or rebound.  Musculoskeletal:     Cervical back: Normal range of motion and neck supple.  Skin:    General: Skin is warm and dry.     Capillary Refill: Capillary refill takes less than 2 seconds.  Neurological:     General: No focal deficit present.     Mental Status: She is alert and oriented to person, place, and time.  Psychiatric:        Mood and Affect: Mood normal.     UC Treatments / Results  Labs (all labs ordered are listed, but only abnormal  results are  displayed) Labs Reviewed  URINALYSIS, COMPLETE (UACMP) WITH MICROSCOPIC - Abnormal; Notable for the following components:      Result Value   APPearance CLOUDY (*)    Hgb urine dipstick TRACE (*)    Nitrite POSITIVE (*)    Leukocytes,Ua SMALL (*)    Bacteria, UA MANY (*)    All other components within normal limits  URINE CULTURE    EKG   Radiology No results found.  Procedures Procedures (including critical care time)  Medications Ordered in UC Medications - No data to display  Initial Impression / Assessment and Plan / UC Course  I have reviewed the triage vital signs and the nursing notes.  Pertinent labs & imaging results that were available during my care of the patient were reviewed by me and considered in my medical decision making (see chart for details).  Clinical impression: 3 days of dysuria, increased urinary frequency, urgency, and suprapubic discomfort.  No flank pain.  No fevers.  Patient does have a history of interstitial cystitis.  Treatment plan: 1.  The findings and treatment plan were discussed in detail with the patient.  Patient was in agreement. 2.  We will obtain a UA.  It was cloudy with positive nitrites and a small amount of leukocytes as well as greater than 50 WBCs.  Consistent with a UTI.  We will treat accordingly and send off the culture. 3.  Educational handouts provided. 4.  Plenty of rest, plenty fluids, Tylenol or Motrin for any fever or discomfort. 5.  Provided a work note just saying she was seen today. 6.  Prescription for Macrobid was sent to the pharmacy. 7.  She could continue with the Azo over-the-counter as needed. 8.  Educated her that if she developed any fever, flank pain, or any other systemic symptoms that could be a sign of an ascending infection and she need to go to the ER.  She voiced verbal understanding. 9.  She was discharged in stable condition and will follow-up here as needed.    Final Clinical Impressions(s) /  UC Diagnoses   Final diagnoses:  Dysuria  Acute cystitis with hematuria  Urinary frequency     Discharge Instructions      As we discussed, your urinalysis is positive for a urinary tract infection.  I will send it for culture.  In the interim, I will treat you with an antibiotic.  Someone may contact you and ask you to stop the antibiotic and switch to another one depending on what the culture and sensitivity shows. Plenty of rest, plenty of fluids. I provided you a work note saying you were seen today but you can go back to work today. Please flush your system with plenty of water. You can continue to use the Azo. Please contact your urologist if your symptoms persist. If you develop any fevers or flank pain then please go to the emergency room.     ED Prescriptions     Medication Sig Dispense Auth. Provider   nitrofurantoin, macrocrystal-monohydrate, (MACROBID) 100 MG capsule Take 1 capsule (100 mg total) by mouth 2 (two) times daily. 10 capsule Delton See, MD      PDMP not reviewed this encounter.   Delton See, MD 10/23/20 [phone]

## 2020-10-26 ENCOUNTER — Telehealth (HOSPITAL_COMMUNITY): Payer: Self-pay | Admitting: Emergency Medicine

## 2020-10-26 LAB — URINE CULTURE
Culture: >=100000 — AB
Special Requests: NORMAL

## 2020-10-26 MED ORDER — CEPHALEXIN 500 MG PO CAPS: 500.0000 mg | ORAL_CAPSULE | Freq: Two times a day (BID) | ORAL | 0 refills | Status: AC

## 2020-11-09 ENCOUNTER — Other Ambulatory Visit: Payer: Self-pay

## 2020-11-09 ENCOUNTER — Ambulatory Visit
Admission: EM | Admit: 2020-11-09 | Discharge: 2020-11-09 | Disposition: A | Discharge status: Home / Self Care | Payer: 59 | Attending: Family Medicine | Admitting: Family Medicine

## 2020-11-09 DIAGNOSIS — N3001 Acute cystitis with hematuria: Secondary | ICD-10-CM | POA: Diagnosis not present

## 2020-11-09 LAB — URINALYSIS, COMPLETE (UACMP) WITH MICROSCOPIC
RBC / HPF: >50 RBC/hpf (ref 0–5)
WBC, UA: >50 WBC/hpf (ref 0–5)

## 2020-11-09 MED ORDER — CEFDINIR 300 MG PO CAPS: 300.0000 mg | ORAL_CAPSULE | Freq: Two times a day (BID) | ORAL | 0 refills | Status: DC

## 2020-11-09 NOTE — ED Provider Notes (Signed)
[facility]    CSN: [id number] Arrival date & time: 11/09/20  3825      History   Chief Complaint Chief Complaint  Patient presents with   Dysuria    HPI  25 year old female presents with urinary symptoms.  Started early this morning.  She reports dysuria, urinary frequency and urgency.  She has taken some over-the-counter medication without resolution.  She has recently had a UTI which was treated with Keflex.  She has a history of interstitial cystitis.  She rates her discomfort as 4/10 in severity.  Denies abdominal pain.  Denies back pain/flank pain.  No fever.  Past Medical History:  Diagnosis Date   Eczema    Interstitial cystitis    Past Surgical History:  Procedure Laterality Date   WISDOM TOOTH EXTRACTION      OB History     Gravida  0   Para  0   Term  0   Preterm  0   AB  0   Living  0      SAB  0   IAB  0   Ectopic  0   Multiple  0   Live Births  0            Home Medications    Prior to Admission medications   Medication Sig Start Date End Date Taking? Authorizing Provider  cefdinir (OMNICEF) 300 MG capsule Take 1 capsule (300 mg total) by mouth 2 (two) times daily. 11/09/20  Yes Lindsey Demonte, Dorie Rank G, DO  norethindrone-ethinyl estradiol 1/35 (ALAYCEN 1/35) tablet Take 1 tablet by mouth daily. 06/26/20   [name], NP    Family History Family History  Problem Relation Age of Onset   Diabetes Father    Asthma Father    Asthma Brother    Cancer Maternal Grandfather        Prostate   Diabetes Paternal Grandmother    Kidney disease Paternal Grandmother    Diabetes Paternal Grandfather    Heart disease Paternal Grandfather    Thyroid disease Mother     Social History Social History   Tobacco Use   Smoking status: Never   Smokeless tobacco: Never  Vaping Use   Vaping Use: Never used  Substance Use Topics   Alcohol use: Yes    Alcohol/week: 0.0 standard drinks    Comment: Rare   Drug use: No      Allergies   Patient has no known allergies.   Review of Systems Review of Systems  Constitutional:  Negative for fever.  Gastrointestinal: Negative.   Genitourinary:  Positive for dysuria, frequency and urgency.    Physical Exam Triage Vital Signs ED Triage Vitals  Enc Vitals Group     BP 11/09/20 0859 132/72     Pulse Rate 11/09/20 0859 75     Resp 11/09/20 0859 18     Temp 11/09/20 0859 98.5 F (36.9 C)     Temp Source 11/09/20 0859 Oral     SpO2 11/09/20 0859 100 %     Weight 11/09/20 0857 160 lb (72.6 kg)     Height 11/09/20 0857 5\' 10"  (1.778 m)     Head Circumference --      Peak Flow --      Pain Score 11/09/20 0857 4     Pain Loc --      Pain Edu? --      Excl. in GC? --    No data found.  Updated Vital  Signs BP 132/72 (BP Location: Left Arm)   Pulse 75   Temp 98.5 F (36.9 C) (Oral)   Resp 18   Ht 5\' 10"  (1.778 m)   Wt 72.6 kg   SpO2 100%   BMI 22.96 kg/m   Visual Acuity Right Eye Distance:   Left Eye Distance:   Bilateral Distance:    Right Eye Near:   Left Eye Near:    Bilateral Near:     Physical Exam Constitutional:      General: She is not in acute distress.    Appearance: Normal appearance. She is not ill-appearing.  HENT:     Head: Normocephalic and atraumatic.  Eyes:     General:        Right eye: No discharge.        Left eye: No discharge.     Conjunctiva/sclera: Conjunctivae normal.  Cardiovascular:     Rate and Rhythm: Normal rate and regular rhythm.  Pulmonary:     Effort: Pulmonary effort is normal.     Breath sounds: Normal breath sounds. No wheezing, rhonchi or rales.  Abdominal:     General: There is no distension.     Palpations: Abdomen is soft.     Tenderness: There is no abdominal tenderness. There is no right CVA tenderness or left CVA tenderness.  Neurological:     Mental Status: She is alert.  Psychiatric:        Mood and Affect: Mood normal.        Behavior: Behavior normal.     UC Treatments /  Results  Labs (all labs ordered are listed, but only abnormal results are displayed) Labs Reviewed  URINALYSIS, COMPLETE (UACMP) WITH MICROSCOPIC - Abnormal; Notable for the following components:      Result Value   Color, Urine ORANGE (*)    APPearance CLOUDY (*)    Glucose, UA   (*)    Value: TEST NOT REPORTED DUE TO COLOR INTERFERENCE OF URINE PIGMENT   Hgb urine dipstick   (*)    Value: TEST NOT REPORTED DUE TO COLOR INTERFERENCE OF URINE PIGMENT   Bilirubin Urine   (*)    Value: TEST NOT REPORTED DUE TO COLOR INTERFERENCE OF URINE PIGMENT   Ketones, ur   (*)    Value: TEST NOT REPORTED DUE TO COLOR INTERFERENCE OF URINE PIGMENT   Protein, ur   (*)    Value: TEST NOT REPORTED DUE TO COLOR INTERFERENCE OF URINE PIGMENT   Nitrite   (*)    Value: TEST NOT REPORTED DUE TO COLOR INTERFERENCE OF URINE PIGMENT   Leukocytes,Ua   (*)    Value: TEST NOT REPORTED DUE TO COLOR INTERFERENCE OF URINE PIGMENT   Bacteria, UA MANY (*)    All other components within normal limits  URINE CULTURE    EKG   Radiology No results found.  Procedures Procedures (including critical care time)  Medications Ordered in UC Medications - No data to display  Initial Impression / Assessment and Plan / UC Course  I have reviewed the triage vital signs and the nursing notes.  Pertinent labs & imaging results that were available during my care of the patient were reviewed by me and considered in my medical decision making (see chart for details).    25 year old female presents with UTI.  Placing on Omnicef.  Awaiting culture.  Final Clinical Impressions(s) / UC Diagnoses   Final diagnoses:  Acute cystitis with hematuria   Discharge Instructions  None    ED Prescriptions     Medication Sig Dispense Auth. Provider   cefdinir (OMNICEF) 300 MG capsule Take 1 capsule (300 mg total) by mouth 2 (two) times daily. 14 capsule Everlene Other G, DO      PDMP not reviewed this encounter.   Everlene Other [place], [place] 11/09/20 [phone]

## 2020-11-09 NOTE — ED Triage Notes (Signed)
Pt c/o onset of dysuria this morning. Pt states she was recently placed on Keflex for UTI and did improve, pt states she took full course. Pt also reports some hematuria. Pt denies any history stones or other urinary issues. Pt denies f/n/v/d or other symptoms.

## 2020-11-10 LAB — URINE CULTURE: Culture: NO GROWTH

## 2021-06-27 ENCOUNTER — Encounter: Payer: Self-pay | Admitting: Nurse Practitioner

## 2021-06-27 ENCOUNTER — Ambulatory Visit (INDEPENDENT_AMBULATORY_CARE_PROVIDER_SITE_OTHER): Payer: 59 | Admitting: Nurse Practitioner

## 2021-06-27 ENCOUNTER — Other Ambulatory Visit: Payer: Self-pay

## 2021-06-27 VITALS — BP 116/72 | Ht 69.0 in | Wt 162.0 lb

## 2021-06-27 DIAGNOSIS — Z23 Encounter for immunization: Secondary | ICD-10-CM | POA: Diagnosis not present

## 2021-06-27 DIAGNOSIS — Z01419 Encounter for gynecological examination (general) (routine) without abnormal findings: Secondary | ICD-10-CM

## 2021-06-27 DIAGNOSIS — Z3041 Encounter for surveillance of contraceptive pills: Secondary | ICD-10-CM | POA: Diagnosis not present

## 2021-06-27 DIAGNOSIS — N910 Primary amenorrhea: Secondary | ICD-10-CM | POA: Diagnosis not present

## 2021-06-27 DIAGNOSIS — Z8349 Family history of other endocrine, nutritional and metabolic diseases: Secondary | ICD-10-CM

## 2021-06-27 MED ORDER — ALYACEN 1/35 1-35 MG-MCG PO TABS: 1.0000 | ORAL_TABLET | Freq: Every day | ORAL | 3 refills | Status: DC

## 2021-06-27 NOTE — Progress Notes (Signed)
° °  Desiree Garrett 26/10/1995 [id number]   History:  26 y.o. G0 presents for annual exam without GYN complaints. Irregular cycles on OCPs. Has always had irregular cycles with negative workup. She has had 1 natural cycle and 1 cycle with provera in the past. Normal pap history. Has received 2 doses of Gardasil.   Gynecologic History No LMP recorded (lmp unknown). (Menstrual status: Irregular Periods).   Contraception/Family planning: OCP (estrogen/progesterone) Sexually active: Yes  Health Maintenance Last Pap: 06/22/2019. Results were: Normal Last mammogram: N/A  Last colonoscopy: N/A Last Dexa: N/A  Past medical history, past surgical history, family history and social history were all reviewed and documented in the EPIC chart. 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 Mother with history of thyroid disease, father with diabetes.   ROS:  A ROS was performed and pertinent positives and negatives are included.  Exam:  Vitals:   06/27/21 1029  BP: 116/72  Weight: 162 lb (73.5 kg)  Height: 5\' 9"  (1.753 m)    Body mass index is 23.92 kg/m.  General appearance:  Normal Thyroid:  Symmetrical, normal in size, without palpable masses or nodularity. Respiratory  Auscultation:  Clear without wheezing or rhonchi Cardiovascular  Auscultation:  Regular rate, without rubs, murmurs or gallops  Edema/varicosities:  Not grossly evident Abdominal  Soft,nontender, without masses, guarding or rebound.  Liver/spleen:  No organomegaly noted  Hernia:  None appreciated  Skin  Inspection:  Grossly normal   Breasts: Examined lying and sitting.   Right: Without masses, retractions, discharge or axillary adenopathy.   Left: Without masses, retractions, discharge or axillary adenopathy. Genitourinary   Inguinal/mons:  Normal without inguinal adenopathy  External genitalia:  Normal appearing vulva with no masses,  tenderness, or lesions  BUS/Urethra/Skene's glands:  Normal  Vagina:  Normal appearing with normal color and discharge, no lesions  Cervix:  Normal appearing without discharge or lesions  Uterus:  Normal in size, shape and contour.  Midline and mobile, nontender  Adnexa/parametria:     Rt: Normal in size, without masses or tenderness.   Lt: Normal in size, without masses or tenderness.  Anus and perineum: Normal  Digital rectal exam: Not indicated  Patient informed chaperone available to be present for breast and pelvic exam. Patient has requested no chaperone to be present. Patient has been advised what will be completed during breast and pelvic exam.   Assessment/Plan:  26 y.o. G0 for annual exam.   Well female exam with routine gynecological exam - Plan: CBC with Differential/Platelet, Comprehensive metabolic panel. Education provided on SBEs, importance of preventative screenings, current guidelines, high calcium diet, regular exercise, and multivitamin daily.    Encounter for surveillance of contraceptive pills - Plan: norethindrone-ethinyl estradiol 1/35 (ALAYCEN 1/35) tablet daily. Taking as prescribed. Refill x 1 year provided.   Family history of thyroid disease in mother - Plan: TSH  Amenorrhea, primary - Rare menses. Not new for her, negative workup. Reports 2 periods in her lifetime - one naturally, one with Provera. Offered Provera every 2-3 months but she is not interested at this time.  Need for HPV vaccination - Plan: HPV 9-valent vaccine,Recombinat. 3rd dose provided today.   Screening for cervical cancer - Normal Pap history.  Will repeat at 3-year interval per guidelines.  Follow-up in 1 year for annual.     2/35 [facility], 12:19 PM 06/27/2021

## 2021-06-28 LAB — COMPREHENSIVE METABOLIC PANEL
AG Ratio: 1.7 (calc) (ref 1.0–2.5)
ALT: 21 U/L (ref 6–29)
AST: 18 U/L (ref 10–30)
Albumin: 4.3 g/dL (ref 3.6–5.1)
Alkaline phosphatase (APISO): 40 U/L (ref 31–125)
BUN: 10 mg/dL (ref 7–25)
CO2: 26 mmol/L (ref 20–32)
Calcium: 9.2 mg/dL (ref 8.6–10.2)
Chloride: 106 mmol/L (ref 98–110)
Creat: 0.68 mg/dL (ref 0.50–0.96)
Globulin: 2.6 g/dL (calc) (ref 1.9–3.7)
Glucose, Bld: 88 mg/dL (ref 65–99)
Potassium: 4.1 mmol/L (ref 3.5–5.3)
Sodium: 140 mmol/L (ref 135–146)
Total Bilirubin: 0.4 mg/dL (ref 0.2–1.2)
Total Protein: 6.9 g/dL (ref 6.1–8.1)

## 2021-06-28 LAB — CBC WITH DIFFERENTIAL/PLATELET
Absolute Monocytes: 374 cells/uL (ref 200–950)
Basophils Absolute: 52 cells/uL (ref 0–200)
Basophils Relative: 1 %
Eosinophils Absolute: 515 cells/uL — ABNORMAL HIGH (ref 15–500)
Eosinophils Relative: 9.9 %
HCT: 39.6 % (ref 35.0–45.0)
Hemoglobin: 13.1 g/dL (ref 11.7–15.5)
Lymphs Abs: 2314 cells/uL (ref 850–3900)
MCH: 29 pg (ref 27.0–33.0)
MCHC: 33.1 g/dL (ref 32.0–36.0)
MCV: 87.6 fL (ref 80.0–100.0)
MPV: 10.5 fL (ref 7.5–12.5)
Monocytes Relative: 7.2 %
Neutro Abs: 1945 cells/uL (ref 1500–7800)
Neutrophils Relative %: 37.4 %
Platelets: 272 10*3/uL (ref 140–400)
RBC: 4.52 10*6/uL (ref 3.80–5.10)
RDW: 12.4 % (ref 11.0–15.0)
Total Lymphocyte: 44.5 %
WBC: 5.2 10*3/uL (ref 3.8–10.8)

## 2021-06-28 LAB — TSH: TSH: 1.54 mIU/L

## 2022-07-03 ENCOUNTER — Ambulatory Visit: Payer: 59 | Admitting: Nurse Practitioner

## 2022-07-03 NOTE — Progress Notes (Deleted)
   Desiree Garrett 06-Aug-1995 [id number]   History:  27 y.o. G0 presents for annual exam without GYN complaints. Irregular cycles on OCPs. Has always had irregular cycles with negative workup. She has had 1 natural cycle and 1 cycle with provera in the past. Normal pap history. Gardasil series completed last year.   Gynecologic History No LMP recorded. (Menstrual status: Irregular Periods).   Contraception/Family planning: OCP (estrogen/progesterone) Sexually active: Yes  Health Maintenance Last Pap: 06/22/2019. Results were: Normal Last mammogram: Not indicated Last colonoscopy: Not indicated Last Dexa: Not indicated  Past medical history, past surgical history, family history and social history were all reviewed and documented in the EPIC chart. 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 Mother with history of thyroid disease, father with diabetes.   ROS:  A ROS was performed and pertinent positives and negatives are included.  Exam:  There were no vitals filed for this visit.   There is no height or weight on file to calculate BMI.  General appearance:  Normal Thyroid:  Symmetrical, normal in size, without palpable masses or nodularity. Respiratory  Auscultation:  Clear without wheezing or rhonchi Cardiovascular  Auscultation:  Regular rate, without rubs, murmurs or gallops  Edema/varicosities:  Not grossly evident Abdominal  Soft,nontender, without masses, guarding or rebound.  Liver/spleen:  No organomegaly noted  Hernia:  None appreciated  Skin  Inspection:  Grossly normal   Breasts: Examined lying and sitting.   Right: Without masses, retractions, discharge or axillary adenopathy.   Left: Without masses, retractions, discharge or axillary adenopathy. Genitourinary   Inguinal/mons:  Normal without inguinal adenopathy  External genitalia:  Normal appearing vulva with no masses, tenderness, or  lesions  BUS/Urethra/Skene's glands:  Normal  Vagina:  Normal appearing with normal color and discharge, no lesions  Cervix:  Normal appearing without discharge or lesions  Uterus:  Normal in size, shape and contour.  Midline and mobile, nontender  Adnexa/parametria:     Rt: Normal in size, without masses or tenderness.   Lt: Normal in size, without masses or tenderness.  Anus and perineum: Normal  Digital rectal exam: Not indicated  Patient informed chaperone available to be present for breast and pelvic exam. Patient has requested no chaperone to be present. Patient has been advised what will be completed during breast and pelvic exam.   Assessment/Plan:  27 y.o. G0 for annual exam.   Well female exam with routine gynecological exam - Plan: CBC with Differential/Platelet, Comprehensive metabolic panel. Education provided on SBEs, importance of preventative screenings, current guidelines, high calcium diet, regular exercise, and multivitamin daily.    Encounter for surveillance of contraceptive pills - Plan: norethindrone-ethinyl estradiol 1/35 (ALAYCEN 1/35) tablet daily. Taking as prescribed. Refill x 1 year provided.   Family history of thyroid disease in mother - Plan: TSH  Amenorrhea, primary - Rare menses. Not new for her, negative workup. Reports 2 periods in her lifetime - one naturally, one with Provera. Offered Provera every 2-3 months but she is not interested at this time.  Screening for cervical cancer - Normal Pap history.  Will repeat at 3-year interval per guidelines.  Follow-up in 1 year for annual.     Tamela [facility], 9:34 AM 07/03/2022

## 2022-08-07 ENCOUNTER — Other Ambulatory Visit: Payer: Self-pay | Admitting: Nurse Practitioner

## 2022-08-07 DIAGNOSIS — Z3041 Encounter for surveillance of contraceptive pills: Secondary | ICD-10-CM

## 2022-08-07 NOTE — Telephone Encounter (Signed)
RF request received for Alacyn 1/35.  Last AEX 06/27/21.  No appointment is scheduled.  RF denied, needs office visit.

## 2022-08-11 ENCOUNTER — Other Ambulatory Visit: Payer: Self-pay | Admitting: Nurse Practitioner

## 2022-08-11 DIAGNOSIS — Z3041 Encounter for surveillance of contraceptive pills: Secondary | ICD-10-CM

## 2023-05-20 ENCOUNTER — Other Ambulatory Visit: Payer: Self-pay | Admitting: Nurse Practitioner

## 2023-05-20 DIAGNOSIS — Z3041 Encounter for surveillance of contraceptive pills: Secondary | ICD-10-CM

## 2023-05-21 NOTE — Telephone Encounter (Signed)
 Med refill request:Desiree Garrett  Last AEX: 06/27/21 -TW Next AEX: 07/20/23 -TW Last MMG (if hormonal med) N/A  Last RX sent 06/27/2021  Call placed to patient to confirm if she is currently on OCP,  no answer. Voicemail full.   Rx refused. Needs appointment.   Routing to provider.

## 2023-07-20 ENCOUNTER — Other Ambulatory Visit (HOSPITAL_COMMUNITY)
Admission: RE | Admit: 2023-07-20 | Discharge: 2023-07-20 | Disposition: A | Discharge status: Home / Self Care | Source: Ambulatory Visit | Attending: Nurse Practitioner | Admitting: Nurse Practitioner

## 2023-07-20 ENCOUNTER — Encounter: Payer: Self-pay | Admitting: Nurse Practitioner

## 2023-07-20 ENCOUNTER — Ambulatory Visit (INDEPENDENT_AMBULATORY_CARE_PROVIDER_SITE_OTHER): Payer: Self-pay | Admitting: Nurse Practitioner

## 2023-07-20 VITALS — BP 122/74 | HR 74 | Ht 69.0 in | Wt 172.0 lb

## 2023-07-20 DIAGNOSIS — Z124 Encounter for screening for malignant neoplasm of cervix: Secondary | ICD-10-CM

## 2023-07-20 DIAGNOSIS — N912 Amenorrhea, unspecified: Secondary | ICD-10-CM

## 2023-07-20 DIAGNOSIS — Z3041 Encounter for surveillance of contraceptive pills: Secondary | ICD-10-CM

## 2023-07-20 DIAGNOSIS — Z01419 Encounter for gynecological examination (general) (routine) without abnormal findings: Secondary | ICD-10-CM | POA: Diagnosis not present

## 2023-07-20 DIAGNOSIS — Z8349 Family history of other endocrine, nutritional and metabolic diseases: Secondary | ICD-10-CM

## 2023-07-20 LAB — PREGNANCY, URINE: Preg Test, Ur: NEGATIVE

## 2023-07-20 MED ORDER — ALYACEN 1/35 1-35 MG-MCG PO TABS: 1.0000 | ORAL_TABLET | Freq: Every day | ORAL | 4 refills | Status: AC

## 2023-07-20 NOTE — Progress Notes (Signed)
 Desiree Garrett 1995-05-14 [id number]   History:  28 y.o. G0 presents for annual exam. H/O irregular cycles with negative workup. Stopped OCPs about 6 months ago due to loss of insurance. Wants to restart. Normal pap history. Received Gardasil series.   Gynecologic History No LMP recorded. (Menstrual status: Irregular Periods). Period Duration (Days): 2-5 Period Pattern: (!) Irregular Menstrual Flow: Moderate Menstrual Control: Maxi pad Dysmenorrhea: (!) Mild Dysmenorrhea Symptoms: Cramping (Sore nipples) Contraception/Family planning: OCP (estrogen/progesterone) Sexually active: Yes  Health Maintenance Last Pap: 06/22/2019. Results were: Normal Last mammogram: Not indicated Last colonoscopy: Not indicated Last Dexa: Not indicated  Past medical history, past surgical history, family history and social history were all reviewed and documented in the EPIC chart. 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 Mother with history of thyroid disease, father with diabetes.   ROS:  A ROS was performed and pertinent positives and negatives are included.  Exam:  Vitals:   07/20/23 1530  BP: 122/74  Pulse: 74  SpO2: 99%  Weight: 172 lb (78 kg)  Height: 5\' 9"  (1.753 m)     Body mass index is 25.4 kg/m.  General appearance:  Normal Thyroid:  Symmetrical, normal in size, without palpable masses or nodularity. Respiratory  Auscultation:  Clear without wheezing or rhonchi Cardiovascular  Auscultation:  Regular rate, without rubs, murmurs or gallops  Edema/varicosities:  Not grossly evident Abdominal  Soft,nontender, without masses, guarding or rebound.  Liver/spleen:  No organomegaly noted  Hernia:  None appreciated  Skin  Inspection:  Grossly normal   Breasts: Examined lying and sitting.   Right: Without masses, retractions, discharge or axillary adenopathy.   Left: Without masses, retractions, discharge or axillary adenopathy. Pelvic: External genitalia:  no lesions               Urethra:  normal appearing urethra with no masses, tenderness or lesions              Bartholins and Skenes: normal                 Vagina: normal appearing vagina with normal color and discharge, no lesions              Cervix: no lesions Bimanual Exam:  Uterus:  no masses or tenderness              Adnexa: no mass, fullness, tenderness              Rectovaginal: Deferred              Anus:  normal, no lesions  Patient informed chaperone available to be present for breast and pelvic exam. Patient has requested no chaperone to be present. Patient has been advised what will be completed during breast and pelvic exam.   UPT negative  Assessment/Plan:  28 y.o. G0 for annual exam.   Well female exam with routine gynecological exam - Plan: CBC with Differential/Platelet, Comprehensive metabolic panel. Education provided on SBEs, importance of preventative screenings, current guidelines, high calcium diet, regular exercise, and multivitamin daily.    Encounter for surveillance of contraceptive pills - Plan: norethindrone-ethinyl estradiol 1/35 (ALAYCEN 1/35) tablet daily. Taking as prescribed. Refill x 1 year provided.   Family history of thyroid disease in mother - Plan: TSH  Amenorrhea - H/O irregular periods. LMP in December 2024. UPT negative. Can start OCPs.  Cervical cancer screening - Plan: Cytology - PAP( [place]). Normal pap history.   Return in about 1 year (around 07/19/2024) for Annual.  Desiree Garrett [facility], 4:12 PM 07/20/2023

## 2023-07-21 ENCOUNTER — Encounter: Payer: Self-pay | Admitting: Nurse Practitioner

## 2023-07-21 LAB — CBC WITH DIFFERENTIAL/PLATELET
Absolute Lymphocytes: 2739 {cells}/uL (ref 850–3900)
Absolute Monocytes: 462 {cells}/uL (ref 200–950)
Basophils Absolute: 40 {cells}/uL (ref 0–200)
Basophils Relative: 0.6 %
Eosinophils Absolute: 521 {cells}/uL — ABNORMAL HIGH (ref 15–500)
Eosinophils Relative: 7.9 %
HCT: 39.6 % (ref 35.0–45.0)
Hemoglobin: 13 g/dL (ref 11.7–15.5)
MCH: 28.7 pg (ref 27.0–33.0)
MCHC: 32.8 g/dL (ref 32.0–36.0)
MCV: 87.4 fL (ref 80.0–100.0)
MPV: 11.1 fL (ref 7.5–12.5)
Monocytes Relative: 7 %
Neutro Abs: 2838 {cells}/uL (ref 1500–7800)
Neutrophils Relative %: 43 %
Platelets: 319 10*3/uL (ref 140–400)
RBC: 4.53 10*6/uL (ref 3.80–5.10)
RDW: 12.4 % (ref 11.0–15.0)
Total Lymphocyte: 41.5 %
WBC: 6.6 10*3/uL (ref 3.8–10.8)

## 2023-07-21 LAB — COMPREHENSIVE METABOLIC PANEL
AG Ratio: 2 (calc) (ref 1.0–2.5)
ALT: 18 U/L (ref 6–29)
AST: 20 U/L (ref 10–30)
Albumin: 4.5 g/dL (ref 3.6–5.1)
Alkaline phosphatase (APISO): 40 U/L (ref 31–125)
BUN: 8 mg/dL (ref 7–25)
CO2: 21 mmol/L (ref 20–32)
Calcium: 9.2 mg/dL (ref 8.6–10.2)
Chloride: 106 mmol/L (ref 98–110)
Creat: 0.6 mg/dL (ref 0.50–0.96)
Globulin: 2.2 g/dL (ref 1.9–3.7)
Glucose, Bld: 84 mg/dL (ref 65–99)
Potassium: 4.1 mmol/L (ref 3.5–5.3)
Sodium: 139 mmol/L (ref 135–146)
Total Bilirubin: 0.3 mg/dL (ref 0.2–1.2)
Total Protein: 6.7 g/dL (ref 6.1–8.1)

## 2023-07-21 LAB — TSH: TSH: 2.2 m[IU]/L

## 2023-07-22 ENCOUNTER — Encounter: Payer: Self-pay | Admitting: Nurse Practitioner

## 2023-07-22 LAB — CYTOLOGY - PAP: Diagnosis: NEGATIVE

## 2023-11-06 ENCOUNTER — Encounter: Payer: Self-pay | Admitting: Emergency Medicine

## 2023-11-06 ENCOUNTER — Ambulatory Visit
Admission: EM | Admit: 2023-11-06 | Discharge: 2023-11-06 | Disposition: A | Discharge status: Home / Self Care | Attending: Family Medicine | Admitting: Family Medicine

## 2023-11-06 DIAGNOSIS — N3001 Acute cystitis with hematuria: Secondary | ICD-10-CM | POA: Diagnosis present

## 2023-11-06 LAB — URINALYSIS, W/ REFLEX TO CULTURE (INFECTION SUSPECTED)
Bilirubin Urine: NEGATIVE
Glucose, UA: NEGATIVE mg/dL
Ketones, ur: NEGATIVE mg/dL
Nitrite: NEGATIVE
Protein, ur: 30 mg/dL — AB
Specific Gravity, Urine: 1.02 (ref 1.005–1.030)
pH: 6 (ref 5.0–8.0)

## 2023-11-06 MED ORDER — CEFADROXIL 500 MG PO CAPS: 500.0000 mg | ORAL_CAPSULE | Freq: Two times a day (BID) | ORAL | 0 refills | Status: DC

## 2023-11-06 MED ORDER — CEFDINIR 300 MG PO CAPS
300.0000 mg | ORAL_CAPSULE | Freq: Two times a day (BID) | ORAL | 0 refills | Status: AC
Start: 2023-11-06 — End: 2023-11-13

## 2023-11-06 MED ORDER — FLUCONAZOLE 150 MG PO TABS: 150.0000 mg | ORAL_TABLET | Freq: Once | ORAL | 0 refills | Status: AC

## 2023-11-06 NOTE — ED Provider Notes (Signed)
 [facility]    CSN: [id number] Arrival date & time: 11/06/23  1918      History   Chief Complaint Chief Complaint  Patient presents with   Dysuria     HPI HPI NUSRAT ENCARNACION is a 28 y.o. female.    Albino LITTIE Pinal presents for dysuria for the past week.  Has history of interstitial cystitis.  Today, around 4 PM started have increasing worse left lower back pain. Tried increased water and decreased items that tend to trigger her Ic prior to arrival.  Has  not had any antibiotics in last 30 days.   Denies known STI exposure.  Storm does not use condoms regularly with her husband. She is  not currently pregnant.  Patient's last menstrual period was 10/06/2023 (approximate).  She has not been on her birth control for the past week as her pharmacy has not had it in-stock.    - Abnormal vaginal discharge: no - vaginal odor: no - vaginal bleeding: no - Dysuria: yes  - Hematuria: no - Urinary urgency:no  - Urinary frequency: no  - Fever: no - Abdominal pain: yes  - Pelvic pain: no - Rash/Skin lesions/mouth ulcers: no - Nausea: no  - Vomiting: no  - Back Pain: yes on the left        Past Medical History:  Diagnosis Date   Eczema    Interstitial cystitis     There are no active problems to display for this patient.   Past Surgical History:  Procedure Laterality Date   WISDOM TOOTH EXTRACTION      OB History     Gravida  0   Para  0   Term  0   Preterm  0   AB  0   Living  0      SAB  0   IAB  0   Ectopic  0   Multiple  0   Live Births  0            Home Medications    Prior to Admission medications   Medication Sig Start Date End Date Taking? Authorizing Provider  cefdinir  (OMNICEF ) 300 MG capsule Take 1 capsule (300 mg total) by mouth 2 (two) times daily for 7 days. 11/06/23 11/13/23 Yes Jarid Sasso, DO  fluconazole  (DIFLUCAN ) 150 MG tablet Take 1 tablet (150 mg total) by mouth once for 1 dose. 11/06/23 11/06/23 Yes  Thomos Domine, DO  norethindrone -ethinyl estradiol  1/35 (ALYACEN  1/35) tablet Take 1 tablet by mouth daily. 07/20/23  Yes Prentiss Annabella LABOR, NP    Family History Family History  Problem Relation Age of Onset   Rheum arthritis Mother    Thyroid  disease Mother    Diabetes Father    Asthma Father    Asthma Brother    Cancer Maternal Grandfather        Prostate   Diabetes Paternal Grandmother    Kidney disease Paternal Grandmother    Diabetes Paternal Grandfather    Heart disease Paternal Grandfather     Social History Social History   Tobacco Use   Smoking status: Never   Smokeless tobacco: Never  Vaping Use   Vaping status: Never Used  Substance Use Topics   Alcohol use: Yes    Alcohol/week: 0.0 standard drinks of alcohol    Comment: Rare   Drug use: No     Allergies   Patient has no known allergies.   Review of Systems Review of Systems: :negative unless  otherwise stated in HPI.      Physical Exam Triage Vital Signs ED Triage Vitals  Encounter Vitals Group     BP 11/06/23 1932 (!) 145/98     Girls Systolic BP Percentile --      Girls Diastolic BP Percentile --      Boys Systolic BP Percentile --      Boys Diastolic BP Percentile --      Pulse Rate 11/06/23 1932 87     Resp 11/06/23 1932 14     Temp 11/06/23 1932 98.8 F (37.1 C)     Temp Source 11/06/23 1932 Oral     SpO2 11/06/23 1932 95 %     Weight 11/06/23 1929 168 lb (76.2 kg)     Height 11/06/23 1929 5' 9 (1.753 m)     Head Circumference --      Peak Flow --      Pain Score 11/06/23 1929 3     Pain Loc --      Pain Education --      Exclude from Growth Chart --    No data found.  Updated Vital Signs BP (!) 145/98 (BP Location: Right Arm)   Pulse 87   Temp 98.8 F (37.1 C) (Oral)   Resp 14   Ht 5' 9 (1.753 m)   Wt 76.2 kg   LMP 10/06/2023 (Approximate)   SpO2 95%   BMI 24.81 kg/m   Visual Acuity Right Eye Distance:   Left Eye Distance:   Bilateral Distance:    Right Eye  Near:   Left Eye Near:    Bilateral Near:     Physical Exam GEN: well appearing female in no acute distress  CVS: well perfused  RESP: speaking in full sentences without pause  ABD: soft, non-tender, non-distended, no palpable masses, left CVA tenderness.    UC Treatments / Results  Labs (all labs ordered are listed, but only abnormal results are displayed) Labs Reviewed  URINALYSIS, W/ REFLEX TO CULTURE (INFECTION SUSPECTED) - Abnormal; Notable for the following components:      Result Value   APPearance HAZY (*)    Hgb urine dipstick MODERATE (*)    Protein, ur 30 (*)    Leukocytes,Ua TRACE (*)    Bacteria, UA MANY (*)    All other components within normal limits  URINE CULTURE    EKG   Radiology No results found.  Procedures Procedures (including critical care time)  Medications Ordered in UC Medications - No data to display  Initial Impression / Assessment and Plan / UC Course  I have reviewed the triage vital signs and the nursing notes.  Pertinent labs & imaging results that were available during my care of the patient were reviewed by me and considered in my medical decision making (see chart for details).       Acute cystitis:  Patient is a 28 y.o. female  who has history of interstitial cystitis presents for 6-7 days of dysuria with new onset left lower back pain.  Overall, patient is well-appearing and afebrile.  Vital signs stable.  UA consistent with acute cystitis and hematuria was supported on microscopy.   Treat with Cefdinir  2 times daily for 7 days. Yeast also seen therefore Diflucan  was prescribed.   Urine culture obtained.  Follow-up sensitivities and change antibiotics, if needed.   - Return precautions including abdominal pain, fever, chills, nausea, or vomiting given. Follow-up,  if symptoms not improving or getting worse. Discussed MDM,  treatment plan and plan for follow-up with patient who agrees with plan.        Final Clinical  Impressions(s) / UC Diagnoses   Final diagnoses:  Acute cystitis with hematuria     Discharge Instructions      Stop by the pharmacy to pick up your prescriptions.  Follow up with your primary care provider or return to the urgent care, if not improving.   I sent your urine for culture and someone will call you to change antibiotics if needed. Your results will be in MyChart.      ED Prescriptions     Medication Sig Dispense Auth. Provider   fluconazole  (DIFLUCAN ) 150 MG tablet Take 1 tablet (150 mg total) by mouth once for 1 dose. 1 tablet Taylan Marez, DO   cefdinir  (OMNICEF ) 300 MG capsule Take 1 capsule (300 mg total) by mouth 2 (two) times daily for 7 days. 14 capsule Bevin Das, DO      PDMP not reviewed this encounter.   Tu Shimmel, DO 11/06/23 2004

## 2023-11-06 NOTE — Discharge Instructions (Addendum)
 Stop by the pharmacy to pick up your prescriptions.  Follow up with your primary care provider or return to the urgent care, if not improving.   I sent your urine for culture and someone will call you to change antibiotics if needed. Your results will be in MyChart.

## 2023-11-06 NOTE — ED Triage Notes (Signed)
 Patient reports dysuria off and on for a week.  Patient reports left sided flank about 2 hours ago.  Patient denies fevers.  Patient denies N/V.

## 2023-11-09 ENCOUNTER — Ambulatory Visit (HOSPITAL_COMMUNITY): Payer: Self-pay

## 2023-11-09 LAB — URINE CULTURE: Culture: >=100000 — AB

## 2024-07-25 ENCOUNTER — Ambulatory Visit: Admitting: Nurse Practitioner

## 2024-08-02 ENCOUNTER — Ambulatory Visit: Admitting: Nurse Practitioner
